# Patient Record
Sex: Male | Born: 1985 | Race: Black or African American | Hispanic: No | Marital: Married | Smoking: Current every day smoker
Health system: Southern US, Community
[De-identification: ages and names within clinical notes are randomized; demographics above are authoritative.]

## PROBLEM LIST (undated history)

## (undated) DIAGNOSIS — D573 Sickle-cell trait: Secondary | ICD-10-CM

## (undated) DIAGNOSIS — N4830 Priapism, unspecified: Secondary | ICD-10-CM

## (undated) HISTORY — PX: APPENDECTOMY: SHX54

---
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
## 2006-03-14 ENCOUNTER — Emergency Department (HOSPITAL_COMMUNITY): Admission: EM | Admit: 2006-03-14 | Discharge: 2006-03-14 | Payer: Self-pay | Admitting: Emergency Medicine

## 2006-05-12 ENCOUNTER — Emergency Department (HOSPITAL_COMMUNITY): Admission: EM | Admit: 2006-05-12 | Discharge: 2006-05-12 | Payer: Self-pay | Admitting: *Deleted

## 2006-08-22 ENCOUNTER — Encounter (INDEPENDENT_AMBULATORY_CARE_PROVIDER_SITE_OTHER): Payer: Self-pay | Admitting: Surgery

## 2006-08-22 ENCOUNTER — Inpatient Hospital Stay (HOSPITAL_COMMUNITY): Admission: EM | Admit: 2006-08-22 | Discharge: 2006-08-23 | Payer: Self-pay | Admitting: Emergency Medicine

## 2006-09-04 ENCOUNTER — Ambulatory Visit: Payer: Self-pay | Admitting: Family Medicine

## 2010-05-31 NOTE — H&P (Signed)
Shawn Fitzgerald, Shawn Fitzgerald              ACCOUNT NO.:  [id number]   MEDICAL RECORD NO.:  [id number]          PATIENT TYPE:  INP   LOCATION:  0103                         FACILITY:  [facility]   PHYSICIAN:  Angelia Mould. Derrell Lolling, M.D.DATE OF BIRTH:  1985-09-08   DATE OF ADMISSION:  08/22/2006  DATE OF DISCHARGE:                              HISTORY & PHYSICAL   CHIEF COMPLAINT:  Right lower quadrant abdominal pain.   HISTORY OF PRESENT ILLNESS:  This is a healthy 25 year old black male  who developed right lower quadrant pain about 7 hours ago, midnight last  night.  This has been persistent and steady in character.  He denies  nausea, vomiting, fever or chills.  Had a normal bowel movement  yesterday.  No prior similar problems.  He was evaluated in the  emergency room.  A CT scan was obtained, which showed an inflamed  appendix and a little bit of fluid in the pelvis.  I was called to see  him.   PAST MEDICAL HISTORY:  He has no medical or surgical problems.   CURRENT MEDICATIONS:  None.   DRUG ALLERGIES:  NONE KNOWN.   SOCIAL HISTORY:  He is single but has one child.  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  He does smoke, but says he is  trying to quit.  Drinks alcohol socially, but says he is quitting.  He  used to smoke marijuana, but says he has recently quit.   FAMILY HISTORY:  He has a daughter with sickle cell trait.  His mother  died of HIV.  Father living and has hypertension and diabetes.   REVIEW OF SYSTEMS:  The 15-system review of systems was obtained and is  noncontributory, except as described above.   PHYSICAL EXAMINATION:  A thin, healthy-appearing black man in mild  distress.  Temperature 96.7, pulse 64, respirations 18, blood pressure 112/75.  EYES:  Sclerae clear.  Extraocular movement is intact.  Ears, mouth,  throat, nose, tongue and oropharynx are without gross lesions.  NECK: Supple, nontender.  No mass.  No jugular distension.  LUNGS: Clear to  auscultation.  No chest wall tenderness.  HEART:  Regular rate and rhythm.  No murmur.  Radial, femoral and dorsalis pedis pulses are palpable.  No peripheral  edema.  ABDOMEN:  Soft, not distended.  Hypoactive bowel sounds; tender with  some guarding in the right lower quadrant; very localized.  No mass.  No  scars.  No hernias noted.  GENITOURINARY:  Penis, scrotum and testes are normal.  No inguinal mass.  EXTREMITIES:  Moves all four extremities well without pain or deformity.  NEUROLOGIC: No gross sensory deficits.   ADMISSION DATA:  CT scan as described above.  White blood cell count  6200.  Urinalysis normal.   ASSESSMENT:  ACUTE APPENDICITIS.  The fluid in the pelvis suggested that  he might have a ruptured appendix; but clinically he does not seem that  sick.   PLAN:  Admission.  IV Zosyn.  He will be taken to the operating room  sometime this morning.  I will be  asking Dr. Clovis Pu. Cornett  to  assume his care.  I have discussed this with the patient and he is in  full agreement.  All of his questions were answered.      Angelia Mould. Derrell Lolling, M.D.  Electronically Signed     HMI/MEDQ  D:  08/22/2006  T:  08/22/2006  Job:  [id number]

## 2010-05-31 NOTE — Op Note (Signed)
Shawn Fitzgerald, Shawn Fitzgerald              ACCOUNT NO.:  [id number]   MEDICAL RECORD NO.:  [id number]          PATIENT TYPE:  INP   LOCATION:  1529                         FACILITY:  [facility]   PHYSICIAN:  Thomas A. Cornett, M.D.DATE OF BIRTH:  1985-03-15   DATE OF PROCEDURE:  08/22/2006  DATE OF DISCHARGE:  08/23/2006                               OPERATIVE REPORT   PREOPERATIVE DIAGNOSES:  Acute appendicitis.   POSTOPERATIVE DIAGNOSES:  Acute appendicitis.   PROCEDURE:  Laparoscopic appendectomy.   SURGEON:  Dr. Harriette Bouillon.   ANESTHESIA:  General endotracheal anesthesia using 0.25% Sensorcaine  local with epinephrine.   ESTIMATED BLOOD LOSS:  20 mL.   SPECIMEN:  Appendix to pathology.   DRAINS:  None.   INDICATIONS FOR PROCEDURE:  The patient is a 25 year old  male with  right lower quadrant pain.  He was seen by Dr. Claud Kelp this  morning and evaluated and found to be acute appendicitis.  He presents  today for laparoscopic appendectomy after I had seen the patient,  reviewed his chart and examined him.  Consent was obtained.   DESCRIPTION OF PROCEDURE:  The patient was brought to the operating room  and placed supine.  The left arm was tucked after induction of general  endotracheal anesthesia.  A Foley catheter was placed and he received  preoperative antibiotics.  The abdomen was prepped and draped in a  sterile fashion. A 1-cm supraumbilical incision was made, dissection was  carried down to his fascia.  The fascia was opened with a scalpel and  Kochers used to grab the fascia and pull it upwards. I used a hemostat  to spread and open the peritoneum.  A pursestring suture of zero Vicryl  was placed and a 12-mm Hasson cannula was placed under direct vision.  Pneumoperitoneum was created at 15 mmHg with CO2 and a laparoscope was  placed.  Upon examination, the appendix was seen in the right lower  quadrant and it was quite inflamed.  It was a very short appendix.   Next  two 5 mm ports were placed, one in the midline approximately halfway  between the pubic symphysis and umbilicus.  A second one was placed in  the left lower quadrant under direct vision. The appendix was grabbed by  its tip.  The harmonic scalpel was used to take down the mesoappendix  all the way down to the base of the appendix at its junction with the  cecum.  Once this was done, a GIA 45 stapling device was used and placed  across the base of the appendix at the junction of the cecum, was fired  and the specimen was amputated. The specimen was put in a specimen bag  and pulled out through the umbilicus.  We used a 5-mm camera for  visualization __________.  I then inspected the stump and it was  hemostatic.  There was oozing from the mesoappendix.  This was  controlled with a small piece of Surgicel which worked well.  I examined  the terminal ileum and it was adhesed down in the right lower quadrant  but  no signs of obstruction were noted.  The remainder of his  laparoscopy was within normal limits with a normal-appearing liver,  gallbladder, stomach, colon and small bowel without any gross evidence  of disease.  At this point in time, I irrigated a small amount and  suctioned this out.  After the CO2 escape and removed  all of my ports.  The umbilical port was  removed and closed with a pursestring suture of 0 Vicryl.  A 4-0  Monocryl for skin was used for closure.  Dermabond was applied for skin  closure.  All final counts of sponge, needle and instruments were found  to be correct at this portion of case.  The patient awoke and taken to  recovery in satisfactory condition.      Thomas A. Cornett, M.D.  Electronically Signed     TAC/MEDQ  D:  08/22/2006  T:  08/23/2006  Job:  [id number]

## 2010-05-31 NOTE — Discharge Summary (Signed)
Shawn Fitzgerald, Shawn Fitzgerald              ACCOUNT NO.:  [id number]   MEDICAL RECORD NO.:  [id number]          PATIENT TYPE:  INP   LOCATION:  1529                         FACILITY:  [facility]   PHYSICIAN:  Thomas A. Cornett, M.D.DATE OF BIRTH:  September 21, 1985   DATE OF ADMISSION:  08/22/2006  DATE OF DISCHARGE:  08/23/2006                               DISCHARGE SUMMARY   MAIN DIAGNOSIS:  Acute appendicitis.   DISCHARGE DIAGNOSIS:  Acute appendicitis.   PROCEDURES PERFORMED:  Laparoscopic appendectomy.   BRIEF HISTORY:  The patient is a 25 year old male admitted on August 22, 2006 with acute appendicitis.   HOSPITAL COURSE:  The patient underwent laparoscopic appendectomy on  August 22, 2006.  He did well and on postop day 1 was discharged home.   DISCHARGE INSTRUCTIONS:  The patient will follow up in 2-3 weeks.  He  will be given a prescription for Vicodin ES for pain, refrain from  working and lifting for the next 7-10 days.  His diet will be regular.  He will refrain from driving until he if off narcotics and he is pain-  free.  He may shower.   CONDITION AT DISCHARGE:  Improved.      Thomas A. Cornett, M.D.  Electronically Signed     TAC/MEDQ  D:  08/23/2006  T:  08/23/2006  Job:  [id number]

## 2010-10-31 LAB — BASIC METABOLIC PANEL
CO2: 27
Chloride: 106
Potassium: 3.7

## 2010-10-31 LAB — DIFFERENTIAL
Eosinophils Relative: 3
Lymphocytes Relative: 28
Lymphs Abs: 1.7
Monocytes Absolute: 0.4
Monocytes Relative: 6

## 2010-10-31 LAB — CBC
HCT: 42.2
Hemoglobin: 14.2
RBC: 4.76
WBC: 6.2

## 2010-10-31 LAB — URINALYSIS, ROUTINE W REFLEX MICROSCOPIC
Glucose, UA: NEGATIVE
Nitrite: NEGATIVE
Specific Gravity, Urine: 1.018
pH: 6.5

## 2012-12-16 ENCOUNTER — Encounter (HOSPITAL_COMMUNITY): Payer: Self-pay | Admitting: Emergency Medicine

## 2012-12-16 ENCOUNTER — Emergency Department (HOSPITAL_COMMUNITY)
Admission: EM | Admit: 2012-12-16 | Discharge: 2012-12-17 | Disposition: A | Discharge status: Home / Self Care | Payer: Self-pay | Attending: Emergency Medicine | Admitting: Emergency Medicine

## 2012-12-16 DIAGNOSIS — N4830 Priapism, unspecified: Secondary | ICD-10-CM | POA: Insufficient documentation

## 2012-12-16 HISTORY — DX: Priapism, unspecified: N48.30

## 2012-12-16 NOTE — ED Notes (Signed)
Pt reports that he has had priapism since 0700 this morning. Had this x2 weeks ago and went away after 12 hours. Pt has not been evaluated for previous episodes of this.

## 2012-12-17 LAB — CBC WITH DIFFERENTIAL/PLATELET
Eosinophils Absolute: 0.1 10*3/uL (ref 0.0–0.7)
Lymphs Abs: 1.8 10*3/uL (ref 0.7–4.0)
MCH: 30.4 pg (ref 26.0–34.0)
Neutrophils Relative %: 70 % (ref 43–77)
Platelets: 157 10*3/uL (ref 150–400)
RBC: 5.27 MIL/uL (ref 4.22–5.81)
WBC: 8.4 10*3/uL (ref 4.0–10.5)

## 2012-12-17 LAB — RAPID URINE DRUG SCREEN, HOSP PERFORMED
Amphetamines: NOT DETECTED
Barbiturates: NOT DETECTED
Opiates: NOT DETECTED
Tetrahydrocannabinol: POSITIVE — AB

## 2012-12-17 LAB — BLOOD GAS, ARTERIAL
Bicarbonate: 14.9 mEq/L — ABNORMAL LOW (ref 20.0–24.0)
Patient temperature: 98.6
pH, Arterial: 6.907 — CL (ref 7.350–7.450)

## 2012-12-17 LAB — POCT I-STAT, CHEM 8
Creatinine, Ser: 1.2 mg/dL (ref 0.50–1.35)
HCT: 50 % (ref 39.0–52.0)
Hemoglobin: 17 g/dL (ref 13.0–17.0)
Potassium: 3.7 mEq/L (ref 3.5–5.1)
Sodium: 140 mEq/L (ref 135–145)

## 2012-12-17 MED ORDER — MORPHINE SULFATE 4 MG/ML IJ SOLN
4.0000 mg | Freq: Once | INTRAMUSCULAR | Status: AC
  Administered 2012-12-17: 4 mg via INTRAVENOUS
  Filled 2012-12-17: qty 1

## 2012-12-17 MED ORDER — SODIUM CHLORIDE 0.9 % IV SOLN
INTRAVENOUS | Status: DC
  Administered 2012-12-17: 05:00:00 via INTRAVENOUS

## 2012-12-17 MED ORDER — PHENYLEPHRINE 200 MCG/ML FOR PRIAPISM / HYPOTENSION
INTRAMUSCULAR | Status: AC
  Administered 2012-12-17: 06:00:00
  Filled 2012-12-17: qty 50

## 2012-12-17 MED ORDER — PHENYLEPHRINE 200 MCG/ML FOR PRIAPISM / HYPOTENSION
100.0000 ug | Freq: Once | INTRAMUSCULAR | Status: AC
  Administered 2012-12-17: 200 ug via INTRACAVERNOUS
  Filled 2012-12-17: qty 50

## 2012-12-17 MED ORDER — OXYCODONE-ACETAMINOPHEN 5-325 MG PO TABS: 1.0000 | ORAL_TABLET | ORAL | Status: DC | PRN

## 2012-12-17 NOTE — Progress Notes (Signed)
Patient ID: Shawn Fitzgerald, male   DOB: 07-10-1985, 27 y.o.   MRN: [id number]  Pt complains of minimal pain. Pain was "above a 10, I wanted to amputate my penis" prior to injection.   PE: Penis appears erect, but is soft and bendable. Minimal pain. Warm to touch. Small hematoma at base from procedures.   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 May d/c from GU point of view.

## 2012-12-17 NOTE — ED Notes (Signed)
Blood gas results given to Dr. Mena Goes.

## 2012-12-17 NOTE — ED Provider Notes (Signed)
Pt with priapism. Seen by urology. Was irrigated/injected. Remains with erection although somewhat softer and reports pain improved but still having some and that it is now worsening again. Apparently told nursing that he could go home "once it was down." Not clear from note what exact end point was. On exam pt with erection. Some asymetric swelling R base, suspect from injection.  Will discuss with urology again.   Raeford Razor, MD 12/17/12 1012

## 2012-12-17 NOTE — ED Provider Notes (Signed)
CSN: [id number]     Arrival date & time 12/16/12  2317 History   First MD Initiated Contact with Patient 12/17/12 0101     Chief Complaint  Patient presents with  . Priapism    (Consider location/radiation/quality/duration/timing/severity/associated sxs/prior Treatment) HPI  Patient reports he has had an erection since 7 AM this morning. He denies having sickle cell disease although he thinks he may have sickle cell trait. He denies any street drug use other than marijuana. He denies taking Viagra or Cialis. He states he had an episode in 2007 that lasted all day long however he did not seek medical treatment. He states he had an episode earlier this year and then again 2 weeks ago that both resolved on their own without treatment. He denies any other precipitating factors.  PCP none   Past Medical History  Diagnosis Date  . Priapism    Past Surgical History  Procedure Laterality Date  . Appendectomy     History reviewed. No pertinent family history. History  Substance Use Topics  . Smoking status: Never Smoker   . Smokeless tobacco: Never Used  . Alcohol Use: Yes     Comment: occ.  starting new job in 2 days Denies street drugs  Review of Systems  All other systems reviewed and are negative.    Allergies  Review of patient's allergies indicates no known allergies.  Home Medications  No current outpatient prescriptions on file.   BP 156/79  Pulse 73  Temp(Src) 97.8 F (36.6 C) (Oral)  Resp 20  SpO2 97%  Vital signs normal     Physical Exam  Nursing note and vitals reviewed. Constitutional: He is oriented to person, place, and time. He appears well-developed and well-nourished.  Non-toxic appearance. He does not appear ill. No distress.  HENT:  Head: Normocephalic and atraumatic.  Right Ear: External ear normal.  Left Ear: External ear normal.  Nose: Nose normal. No mucosal edema or rhinorrhea.  Mouth/Throat: Mucous membranes are normal. No dental  abscesses or uvula swelling.  Eyes: Conjunctivae and EOM are normal. Pupils are equal, round, and reactive to light.  Neck: Normal range of motion and full passive range of motion without pain. Neck supple.  Pulmonary/Chest: Effort normal. No respiratory distress. He has no rhonchi. He exhibits no crepitus.  Abdominal: Normal appearance.  Genitourinary:  priapism noted.  Musculoskeletal: Normal range of motion. He exhibits no edema and no tenderness.  Moves all extremities well.   Neurological: He is alert and oriented to person, place, and time. He has normal strength. No cranial nerve deficit.  Skin: Skin is warm, dry and intact. No rash noted. No erythema. No pallor.  Psychiatric: He has a normal mood and affect. His speech is normal and behavior is normal. His mood appears not anxious.    ED Course  Procedures (including critical care time)  Medications  0.9 %  sodium chloride infusion ( Intravenous Stopped 12/17/12 0645)  phenylephrine 200 mcg / ml CONC. DILUTION INJ (ED / Urology USE ONLY) (200 mcg Intracavernosal Given 12/17/12 0141)  morphine 4 MG/ML injection 4 mg (4 mg Intravenous Given 12/17/12 0458)  PHENYLEPHRINE 200 MCG/ML FOR PRIAPISM / HYPOTENSION 200 mcg/ml injection SOLN (  Given 12/17/12 0550)  morphine 4 MG/ML injection 4 mg (4 mg Intravenous Given 12/17/12 0643)   01:40 pt given 100 mcg of phenylephrine in each side (total 200 mcg).  Review of prior tests shows Hb of 14 in 2008  03:20 pt still has priapism,  states it was little better on the right but has returned. Pt injected with 250 mcg of phenylephrine in each side (total 500 mcg).   04:28 Pt still has priapism.  04:36 Dr Mena Goes will come see patient.   Labs Review Results for orders placed during the hospital encounter of 12/16/12  URINE RAPID DRUG SCREEN (HOSP PERFORMED)      Result Value Range   Opiates NONE DETECTED  NONE DETECTED   Cocaine NONE DETECTED  NONE DETECTED   Benzodiazepines NONE DETECTED   NONE DETECTED   Amphetamines NONE DETECTED  NONE DETECTED   Tetrahydrocannabinol POSITIVE (*) NONE DETECTED   Barbiturates NONE DETECTED  NONE DETECTED  CBC WITH DIFFERENTIAL      Result Value Range   WBC 8.4  4.0 - 10.5 K/uL   RBC 5.27  4.22 - 5.81 MIL/uL   Hemoglobin 16.0  13.0 - 17.0 g/dL   HCT 16.1  09.6 - 04.5 %   MCV 82.5  78.0 - 100.0 fL   MCH 30.4  26.0 - 34.0 pg   MCHC 36.8 (*) 30.0 - 36.0 g/dL   RDW 40.9  81.1 - 91.4 %   Platelets 157  150 - 400 K/uL   Neutrophils Relative % 70  43 - 77 %   Neutro Abs 5.9  1.7 - 7.7 K/uL   Lymphocytes Relative 22  12 - 46 %   Lymphs Abs 1.8  0.7 - 4.0 K/uL   Monocytes Relative 7  3 - 12 %   Monocytes Absolute 0.6  0.1 - 1.0 K/uL   Eosinophils Relative 1  0 - 5 %   Eosinophils Absolute 0.1  0.0 - 0.7 K/uL   Basophils Relative 0  0 - 1 %   Basophils Absolute 0.0  0.0 - 0.1 K/uL  POCT I-STAT, CHEM 8      Result Value Range   Sodium 140  135 - 145 mEq/L   Potassium 3.7  3.5 - 5.1 mEq/L   Chloride 103  96 - 112 mEq/L   BUN 13  6 - 23 mg/dL   Creatinine, Ser 7.82  0.50 - 1.35 mg/dL   Glucose, Bld 956 (*) 70 - 99 mg/dL   Calcium, Ion 2.13  0.86 - 1.23 mmol/L   TCO2 24  0 - 100 mmol/L   Hemoglobin 17.0  13.0 - 17.0 g/dL   HCT 57.8  46.9 - 62.9 %   Laboratory interpretation all normal except +UDS    Imaging Review No results found.  EKG Interpretation   None       MDM   1. Priapism     Disposition per Dr Mickeal Skinner, MD, Franz Dell, MD 12/17/12 [phone]

## 2012-12-17 NOTE — Progress Notes (Addendum)
Patient ID: Shawn Fitzgerald, male   DOB: 1985-10-29, 28 y.o.   MRN: [id number]  Procedure: penile aspiration and injection of phenylephrine Surgeon: Mena Goes  Description - right corpora prepped and 21 ga needle inserted near base. Irrigated poorly. Aspirated - ABG sent as reported earlier. Injected 200 microgram/1 ml q 3-5 minutes x 30 minutes. Confirmed mixture with nurse.   I made up new 200 microgram mix. Inserted 21 ga in left corpora. Aspirated well. Injcted 200 mcg / 1 ml q 3-5 min x 30 min.   Penis much softer and corpora compressible but still appears erect likely from ischemic changes. Pt with substantial relief of pain.   Pt tolerated well. EBL 30ml.   Add: I should add pt was asleep and resting when I finished. He requested pain meds when I removed the needles.

## 2012-12-17 NOTE — ED Notes (Signed)
Pt reports having erection again, sts pain is also increasing. Dr Juleen China notified.

## 2012-12-17 NOTE — Consult Note (Signed)
Consult: priapism Requested by Dr. Andreas Ohm  History of Present Illness:  Pt has a rigid painful erection since he woke up at 7 AM yesterday morning. Dr. Lynelle Doctor tried to inject both corprora.  Pt with h/o priapism several years ago and several weeks ago, both of which resolved.   Past Medical History  Diagnosis Date  . Priapism    Past Surgical History  Procedure Laterality Date  . Appendectomy      Home Medications:   (Not in a hospital admission) Allergies: No Known Allergies  History reviewed. No pertinent family history. Social History:  reports that he has never smoked. He has never used smokeless tobacco. He reports that he drinks alcohol. He reports that he uses illicit drugs (Marijuana).  ROS: A complete review of systems was performed.  All systems are negative except for pertinent findings as noted. Review of Systems  All other systems reviewed and are negative.     Physical Exam:  Vital signs in last 24 hours: Temp:  [97.8 F (36.6 C)-98.8 F (37.1 C)] 97.8 F (36.6 C) (12/02 0304) Pulse Rate:  [66-73] 73 (12/02 0304) Resp:  [16-20] 20 (12/02 0304) BP: (156-157)/(79-82) 156/79 mmHg (12/02 0304) SpO2:  [97 %] 97 % (12/02 0304) General:  Alert and oriented, No acute distress HEENT: Normocephalic, atraumatic Neck: No JVD or lymphadenopathy Cardiovascular: Regular rate and rhythm Lungs: Regular rate and effort Abdomen: Soft, nontender, nondistended, no abdominal masses Back: No CVA tenderness Extremities: No edema Neurologic: Grossly intact GU: painful, rigid erection  Laboratory Data:  Results for orders placed during the hospital encounter of 12/16/12 (from the past 24 hour(s))  URINE RAPID DRUG SCREEN (HOSP PERFORMED)     Status: Abnormal   Collection Time    12/17/12  2:27 AM      Result Value Range   Opiates NONE DETECTED  NONE DETECTED   Cocaine NONE DETECTED  NONE DETECTED   Benzodiazepines NONE DETECTED  NONE DETECTED   Amphetamines NONE  DETECTED  NONE DETECTED   Tetrahydrocannabinol POSITIVE (*) NONE DETECTED   Barbiturates NONE DETECTED  NONE DETECTED  POCT I-STAT, CHEM 8     Status: Abnormal   Collection Time    12/17/12  4:56 AM      Result Value Range   Sodium 140  135 - 145 mEq/L   Potassium 3.7  3.5 - 5.1 mEq/L   Chloride 103  96 - 112 mEq/L   BUN 13  6 - 23 mg/dL   Creatinine, Ser 1.61  0.50 - 1.35 mg/dL   Glucose, Bld 096 (*) 70 - 99 mg/dL   Calcium, Ion 0.45  4.09 - 1.23 mmol/L   TCO2 24  0 - 100 mmol/L   Hemoglobin 17.0  13.0 - 17.0 g/dL   HCT 81.1  91.4 - 78.2 %   No results found for this or any previous visit (from the past 240 hour(s)). Creatinine:  Recent Labs  12/17/12 0456  CREATININE 1.20    Impression/Assessment:  Priapism  Plan:  Discussed with patient nature, R/B/A to irrigation/aspiration attempt including OR for distal shunt. He elects to proceed with Irrigation/aspiration.   Antony Haste 12/17/2012, 4:59 AM

## 2012-12-17 NOTE — Progress Notes (Signed)
[facility] provided pt with a list of primary care resources and a [facility] Orange Card application.  °

## 2012-12-17 NOTE — ED Notes (Signed)
Pt reports some pain relief at this time, penis is still erected, however pt sts it's much better now. Will continue to monitor.

## 2012-12-17 NOTE — Progress Notes (Signed)
Patient ID: Shawn Fitzgerald, male   DOB: [birth year], 27 y.o.   MRN: [id number]  Penile ABG: pH 6.9, pCO2 78.9, pO2 below reportable range, sO2 21%   Pt reports he voided with a good flow prior to my arrival.   Discussed risks of prolonged priapism and distal shunts - impotence among others.

## 2013-01-14 ENCOUNTER — Encounter (HOSPITAL_COMMUNITY): Payer: Self-pay | Admitting: Emergency Medicine

## 2013-01-14 ENCOUNTER — Emergency Department (HOSPITAL_COMMUNITY)
Admission: EM | Admit: 2013-01-14 | Discharge: 2013-01-14 | Disposition: A | Discharge status: Home / Self Care | Payer: Self-pay | Attending: Emergency Medicine | Admitting: Emergency Medicine

## 2013-01-14 DIAGNOSIS — N529 Male erectile dysfunction, unspecified: Secondary | ICD-10-CM

## 2013-01-14 NOTE — ED Provider Notes (Signed)
CSN: [id number]     Arrival date & time 01/14/13  0106 History   First MD Initiated Contact with Patient 01/14/13 [phone]     Chief Complaint  Patient presents with  . Erectile Dysfunction   (Consider location/radiation/quality/duration/timing/severity/associated sxs/prior Treatment) HPI Comments: Patient reports having erectile dysfunction since having priaprism treated 4 weeks ago. No other complaints.  Patient is a 27 y.o. male presenting with erectile dysfunction. The history is provided by the patient. No language interpreter was used.  Erectile Dysfunction This is a new problem. The current episode started 1 to 4 weeks ago. The problem occurs constantly. The problem has been unchanged. Pertinent negatives include no abdominal pain, change in bowel habit, fever, neck pain or urinary symptoms. Nothing aggravates the symptoms. He has tried nothing for the symptoms. The treatment provided no relief.    Past Medical History  Diagnosis Date  . Priapism    Past Surgical History  Procedure Laterality Date  . Appendectomy     History reviewed. No pertinent family history. History  Substance Use Topics  . Smoking status: Never Smoker   . Smokeless tobacco: Never Used  . Alcohol Use: Yes     Comment: occ.    Review of Systems  Constitutional: Negative for fever.  Gastrointestinal: Negative for abdominal pain and change in bowel habit.  Musculoskeletal: Negative for neck pain.  All other systems reviewed and are negative.    Allergies  Review of patient's allergies indicates no known allergies.  Home Medications   Current Outpatient Rx  Name  Route  Sig  Dispense  Refill  . oxyCODONE-acetaminophen (PERCOCET/ROXICET) 5-325 MG per tablet   Oral   Take 1 tablet by mouth every 4 (four) hours as needed for severe pain.   6 tablet   0    BP 124/64  Pulse 62  Temp(Src) 98.6 F (37 C) (Oral)  Resp 15  Ht 5\' 10"  (1.778 m)  Wt 155 lb (70.308 kg)  BMI 22.24 kg/m2  SpO2  98% Physical Exam  Nursing note and vitals reviewed. Constitutional: He is oriented to person, place, and time. He appears well-developed and well-nourished.  HENT:  Head: Normocephalic and atraumatic.  Eyes: Conjunctivae and EOM are normal.  Neck: Normal range of motion.  Cardiovascular: Normal rate.   Pulmonary/Chest: Effort normal.  Abdominal: He exhibits no distension.  Genitourinary: Penis normal.  Normal circumcised male, no masses, lesions, or other abnormality or deformity penis, scrotum, testicles, no evidence of infection, no discharge  Musculoskeletal: Normal range of motion.  Neurological: He is alert and oriented to person, place, and time.  Skin: Skin is dry.  Psychiatric: He has a normal mood and affect. His behavior is normal. Judgment and thought content normal.    ED Course  Procedures (including critical care time) Labs Review Labs Reviewed - No data to display Imaging Review No results found.  EKG Interpretation   None       MDM   1. Erectile dysfunction    Patient with erectile dysfunction. Patient recently had treatment for priaprism. He states that he is been unable to obtain an erection since treatment. Recommend urology followup.  Patient seen by and discussed with Dr. Dierdre Highman, who agrees with the plan.  Patient is stable and ready for discharge.    Roxy Horseman, PA-C 01/14/13 [phone]

## 2013-01-14 NOTE — ED Provider Notes (Signed)
Medical screening examination/treatment/procedure(s) were performed by non-physician practitioner and as supervising physician I was immediately available for consultation/collaboration.    Jolanda Mccann, MD 01/14/13 2304 

## 2013-01-14 NOTE — ED Notes (Signed)
Pt reports pain when trying to obtain an erection. Pt has hx of priapism with the last episode the beginning of the month that lasted 5 days.

## 2014-07-16 ENCOUNTER — Encounter (HOSPITAL_COMMUNITY): Payer: Self-pay | Admitting: *Deleted

## 2014-07-16 ENCOUNTER — Emergency Department (HOSPITAL_COMMUNITY): Payer: Self-pay

## 2014-07-16 ENCOUNTER — Emergency Department (HOSPITAL_COMMUNITY)
Admission: EM | Admit: 2014-07-16 | Discharge: 2014-07-16 | Disposition: A | Discharge status: Home / Self Care | Payer: Self-pay | Attending: Emergency Medicine | Admitting: Emergency Medicine

## 2014-07-16 DIAGNOSIS — S43005A Unspecified dislocation of left shoulder joint, initial encounter: Secondary | ICD-10-CM

## 2014-07-16 DIAGNOSIS — X58XXXA Exposure to other specified factors, initial encounter: Secondary | ICD-10-CM | POA: Insufficient documentation

## 2014-07-16 DIAGNOSIS — Y9389 Activity, other specified: Secondary | ICD-10-CM | POA: Insufficient documentation

## 2014-07-16 DIAGNOSIS — Y92009 Unspecified place in unspecified non-institutional (private) residence as the place of occurrence of the external cause: Secondary | ICD-10-CM | POA: Insufficient documentation

## 2014-07-16 DIAGNOSIS — Y998 Other external cause status: Secondary | ICD-10-CM | POA: Insufficient documentation

## 2014-07-16 DIAGNOSIS — Z87448 Personal history of other diseases of urinary system: Secondary | ICD-10-CM | POA: Insufficient documentation

## 2014-07-16 DIAGNOSIS — S43015A Anterior dislocation of left humerus, initial encounter: Secondary | ICD-10-CM | POA: Insufficient documentation

## 2014-07-16 MED ORDER — HYDROCODONE-ACETAMINOPHEN 5-325 MG PO TABS: 1.0000 | ORAL_TABLET | ORAL | Status: DC | PRN

## 2014-07-16 MED ORDER — ONDANSETRON HCL 4 MG/2ML IJ SOLN
4.0000 mg | Freq: Once | INTRAMUSCULAR | Status: AC | PRN
  Administered 2014-07-16: 4 mg via INTRAVENOUS
  Filled 2014-07-16: qty 2

## 2014-07-16 MED ORDER — PROPOFOL 10 MG/ML IV BOLUS
40.0000 mg | INTRAVENOUS | Status: DC | PRN
  Filled 2014-07-16: qty 1

## 2014-07-16 MED ORDER — HYDROMORPHONE HCL 1 MG/ML IJ SOLN
1.0000 mg | INTRAMUSCULAR | Status: DC | PRN
  Administered 2014-07-16: 1 mg via INTRAVENOUS
  Filled 2014-07-16: qty 1

## 2014-07-16 MED ORDER — HYDROMORPHONE HCL 1 MG/ML IJ SOLN
1.0000 mg | Freq: Once | INTRAMUSCULAR | Status: AC
  Administered 2014-07-16: 1 mg via INTRAVENOUS
  Filled 2014-07-16: qty 1

## 2014-07-16 MED ORDER — PROPOFOL 10 MG/ML IV BOLUS
INTRAVENOUS | Status: AC | PRN
  Administered 2014-07-16 (×2): 40 mg via INTRAVENOUS

## 2014-07-16 NOTE — Discharge Instructions (Signed)
Shoulder Dislocation ° Shoulder dislocation is when your upper arm bone (humerus) is forced out of your shoulder joint. Your doctor will put your shoulder back into the joint by pulling on your arm or through surgery. Your arm will be placed in a shoulder immobilizer or sling. The shoulder immobilizer or sling holds your shoulder in place while it heals. °HOME CARE  °· Rest your injured joint. Do not move it until instructed to do so. °· Put ice on your injured joint as told by your doctor. °¨ Put ice in a plastic bag. °¨ Place a towel between your skin and the bag. °¨ Leave the ice on for 15-20 minutes at a time, every 2 hours while you are awake. °· Only take medicines as told by your doctor. °· Squeeze a ball to exercise your hand. °GET HELP RIGHT AWAY IF:  °· Your splint or sling becomes damaged. °· Your pain becomes worse, not better. °· You lose feeling in your arm or hand. °· Your arm or hand becomes white or cold. °MAKE SURE YOU:  °· Understand these instructions. °· Will watch your condition. °· Will get help right away if you are not doing well or get worse. °Document Released: 03/27/2011 Document Reviewed: 03/27/2011 °Exit[employer]® Patient Information ©2015 [employer]. This information is not intended to replace advice given to you by your health care provider. Make sure you discuss any questions you have with your health care provider. ° °

## 2014-07-16 NOTE — ED Provider Notes (Signed)
CSN: [id number]     Arrival date & time 07/16/14  1128 History   First MD Initiated Contact with Patient 07/16/14 1130     Chief Complaint  Patient presents with  . Dislocation   HPI Patient presents to the emergency room with complaints of acute left shoulder pain. The patient was playing with his children at home. He did not fall but he acutely felt pain in his left shoulder. He also noted a deformity.  The pain is severe. Any movement whatsoever exacerbates the pain. He cannot find a comfortable position. Patient called EMS and was brought into the emergency room. Past Medical History  Diagnosis Date  . Priapism    Past Surgical History  Procedure Laterality Date  . Appendectomy     No family history on file. History  Substance Use Topics  . Smoking status: Never Smoker   . Smokeless tobacco: Never Used  . Alcohol Use: Yes     Comment: occ.    Review of Systems  Constitutional: Positive for diaphoresis.  Gastrointestinal: Negative for nausea and vomiting.  All other systems reviewed and are negative.     Allergies  Review of patient's allergies indicates no known allergies.  Home Medications   Prior to Admission medications   Medication Sig Start Date End Date Taking? Authorizing Provider  HYDROcodone-acetaminophen (NORCO/VICODIN) 5-325 MG per tablet Take 1-2 tablets by mouth every 4 (four) hours as needed. 07/16/14   Linwood Dibbles, MD   BP 120/97 mmHg  Pulse 72  Temp(Src) 97.7 F (36.5 C) (Oral)  Resp 18  SpO2 100% Physical Exam  Constitutional: He appears well-developed and well-nourished. He appears distressed.  HENT:  Head: Normocephalic and atraumatic.  Right Ear: External ear normal.  Left Ear: External ear normal.  Mouth/Throat: No oropharyngeal exudate.  Eyes: Conjunctivae are normal. Right eye exhibits no discharge. Left eye exhibits no discharge. No scleral icterus.  Neck: Neck supple. No tracheal deviation present.  Cardiovascular: Normal rate and  regular rhythm.   Pulmonary/Chest: Effort normal. No stridor. No respiratory distress. He has no wheezes.  Abdominal: Soft. He exhibits no distension. There is no tenderness.  Musculoskeletal: He exhibits no edema.       Left shoulder: He exhibits tenderness, bony tenderness, deformity and pain. He exhibits normal pulse and normal strength.  Normal radial pulse, normal sensation in his hand  Neurological: He is alert. Cranial nerve deficit: no gross deficits.  Skin: Skin is warm and dry. No rash noted.  Psychiatric: He has a normal mood and affect.  Nursing note and vitals reviewed.   ED Course  Reduction of dislocation Date/Time: 07/16/2014 12:53 PM Performed by: Linwood Dibbles Authorized by: Linwood Dibbles Consent: Verbal consent obtained. Written consent obtained. Risks and benefits: risks, benefits and alternatives were discussed Consent given by: patient Patient understanding: patient states understanding of the procedure being performed Patient consent: the patient's understanding of the procedure matches consent given Patient identity confirmed: verbally with patient and arm band Time out: Immediately prior to procedure a "time out" was called to verify the correct patient, procedure, equipment, support staff and site/side marked as required. Comments: Traction counter traction, on exam, appears reduced.  Will confirm with xray  Procedural sedation Date/Time: 07/16/2014 12:54 PM Performed by: Linwood Dibbles Authorized by: Linwood Dibbles Consent: Verbal consent obtained. Written consent obtained. Risks and benefits: risks, benefits and alternatives were discussed Consent given by: patient Patient identity confirmed: verbally with patient Time out: Immediately prior to procedure a "time out" was  called to verify the correct patient, procedure, equipment, support staff and site/side marked as required. Patient sedated: yes Sedation type: moderate (conscious) sedation Sedatives:  propofol Sedation start date/time: 07/16/2014 12:40 PM Sedation end date/time: 07/16/2014 12:54 PM Vitals: Vital signs were monitored during sedation. Patient tolerance: Patient tolerated the procedure well with no immediate complications   (including critical care time) Labs Review Labs Reviewed - No data to display  Imaging Review Dg Shoulder Left  07/16/2014   CLINICAL DATA:  Post reduction.  EXAM: LEFT SHOULDER - 2+ VIEW  COMPARISON:  None.  FINDINGS: Shoulder dislocation has been reduced. No concerning osseous features.  IMPRESSION: Satisfactory postreduction appearance.   Electronically Signed   By: Elsie StainJohn T Curnes M.D.   On: 07/16/2014 13:52   Dg Shoulder Left  07/16/2014   CLINICAL DATA:  29 year old male with a history of left shoulder pain and dislocation.  EXAM: LEFT SHOULDER - 2+ VIEW  COMPARISON:  05/12/2006  FINDINGS: Left anterior shoulder dislocation. No acute bony abnormality identified, though details are somewhat obscured by the overlapping bony structures.  IMPRESSION: Left anterior shoulder dislocation. Repeat imaging is warranted once reduced.  Signed,  Yvone NeuJaime S. Loreta AveWagner, DO  [facility]  [facility]   Electronically Signed   By: Gilmer MorJaime  Wagner D.O.   On: 07/16/2014 12:26    Medications  HYDROmorphone (DILAUDID) injection 1 mg (1 mg Intravenous Given 07/16/14 1227)  propofol (DIPRIVAN) 10 mg/mL bolus/IV push 40 mg (not administered)  HYDROmorphone (DILAUDID) injection 1 mg (1 mg Intravenous Given 07/16/14 1156)  ondansetron (ZOFRAN) injection 4 mg (4 mg Intravenous Given 07/16/14 1356)  propofol (DIPRIVAN) 10 mg/mL bolus/IV push ( Intravenous Stopped 07/16/14 1258)     MDM   Final diagnoses:  Shoulder dislocation, left, initial encounter    Pre and post xrays personally reviewed.  Successful reduction of a shoulder dislocation.  Dc home with a shoulder imobilizer.  Follow up with orthopedic surgery for further outpatient  evaluation and treatment.   Linwood DibblesJon Sharelle Burditt, MD 07/16/14 [phone]

## 2014-07-16 NOTE — ED Notes (Signed)
Bed: NF62WA12 Expected date:  Expected time:  Means of arrival:  Comments: Ems- dislocated shoulder

## 2014-07-16 NOTE — Progress Notes (Signed)
CM spoke with pt and per pt permission with male visitor at bedside who [place].  CM discussed and provided written information for self pay pcps, discussed the importance of pcp vs EDP services for f/u care, www.needymeds.org, www.goodrx.com, discounted pharmacies and other [place] resources such as [facility] , [facility], affordable care act,  [place] med assist, financial assistance, self pay dental services, [place] med assist, DSS and  health department  Reviewed resources for [place] self pay pcps like [facility], [facility] medicine [facility] [address], [facility], [facility], local urgent care centers, [facility], [facility], general medical clinics, family services of the [place], [facility] plus others, medication resources, [facility] out patient pharmacies and housing Pt voiced understanding and appreciation of resources provided   Provided [facility] contact information Pt agreed to a referral Cm completed referral Pt to be contact by [facility] clinical liason

## 2014-07-16 NOTE — ED Notes (Addendum)
Per EMS report: pt was playing with child and dislocated left shoulder. Deformity noted by EMS. EMS reports good radial pulses.  Pt a/o x 4.  Hx of left shoulder dislocation.  EMS gave pt of fentanyl.

## 2014-09-22 ENCOUNTER — Encounter (HOSPITAL_COMMUNITY): Payer: Self-pay | Admitting: *Deleted

## 2014-09-22 ENCOUNTER — Emergency Department (HOSPITAL_COMMUNITY)
Admission: EM | Admit: 2014-09-22 | Discharge: 2014-09-22 | Disposition: A | Discharge status: Home / Self Care | Payer: Self-pay | Attending: Emergency Medicine | Admitting: Emergency Medicine

## 2014-09-22 ENCOUNTER — Emergency Department (HOSPITAL_COMMUNITY): Payer: Self-pay

## 2014-09-22 DIAGNOSIS — R109 Unspecified abdominal pain: Secondary | ICD-10-CM

## 2014-09-22 DIAGNOSIS — R1030 Lower abdominal pain, unspecified: Secondary | ICD-10-CM | POA: Insufficient documentation

## 2014-09-22 DIAGNOSIS — Z87438 Personal history of other diseases of male genital organs: Secondary | ICD-10-CM | POA: Insufficient documentation

## 2014-09-22 LAB — CBC WITH DIFFERENTIAL/PLATELET
BASOS PCT: 1 % (ref 0–1)
Basophils Absolute: 0 10*3/uL (ref 0.0–0.1)
EOS ABS: 0.2 10*3/uL (ref 0.0–0.7)
Eosinophils Relative: 5 % (ref 0–5)
HEMATOCRIT: 42.3 % (ref 39.0–52.0)
HEMOGLOBIN: 14.8 g/dL (ref 13.0–17.0)
LYMPHS ABS: 1.4 10*3/uL (ref 0.7–4.0)
Lymphocytes Relative: 41 % (ref 12–46)
MCH: 30.5 pg (ref 26.0–34.0)
MCHC: 35 g/dL (ref 30.0–36.0)
MCV: 87 fL (ref 78.0–100.0)
Monocytes Absolute: 0.3 10*3/uL (ref 0.1–1.0)
Monocytes Relative: 9 % (ref 3–12)
NEUTROS ABS: 1.6 10*3/uL — AB (ref 1.7–7.7)
NEUTROS PCT: 44 % (ref 43–77)
Platelets: 159 10*3/uL (ref 150–400)
RBC: 4.86 MIL/uL (ref 4.22–5.81)
RDW: 13.5 % (ref 11.5–15.5)
WBC: 3.5 10*3/uL — AB (ref 4.0–10.5)

## 2014-09-22 LAB — URINE MICROSCOPIC-ADD ON

## 2014-09-22 LAB — URINALYSIS, ROUTINE W REFLEX MICROSCOPIC
BILIRUBIN URINE: NEGATIVE
Glucose, UA: NEGATIVE mg/dL
HGB URINE DIPSTICK: NEGATIVE
KETONES UR: NEGATIVE mg/dL
NITRITE: NEGATIVE
Protein, ur: NEGATIVE mg/dL
Specific Gravity, Urine: 1.015 (ref 1.005–1.030)
UROBILINOGEN UA: 0.2 mg/dL (ref 0.0–1.0)
pH: 6 (ref 5.0–8.0)

## 2014-09-22 LAB — BASIC METABOLIC PANEL
Anion gap: 4 — ABNORMAL LOW (ref 5–15)
BUN: 13 mg/dL (ref 6–20)
CHLORIDE: 104 mmol/L (ref 101–111)
CO2: 29 mmol/L (ref 22–32)
Calcium: 8.8 mg/dL — ABNORMAL LOW (ref 8.9–10.3)
Creatinine, Ser: 0.99 mg/dL (ref 0.61–1.24)
GFR calc non Af Amer: >60 mL/min (ref >60)
Glucose, Bld: 86 mg/dL (ref 65–99)
POTASSIUM: 4 mmol/L (ref 3.5–5.1)
SODIUM: 137 mmol/L (ref 135–145)

## 2014-09-22 MED ORDER — KETOROLAC TROMETHAMINE 30 MG/ML IJ SOLN
30.0000 mg | Freq: Once | INTRAMUSCULAR | Status: AC
  Administered 2014-09-22: 30 mg via INTRAVENOUS
  Filled 2014-09-22: qty 1

## 2014-09-22 MED ORDER — ONDANSETRON HCL 4 MG/2ML IJ SOLN
4.0000 mg | Freq: Once | INTRAMUSCULAR | Status: AC
  Administered 2014-09-22: 4 mg via INTRAVENOUS
  Filled 2014-09-22: qty 2

## 2014-09-22 MED ORDER — TRAMADOL HCL 50 MG PO TABS: 50.0000 mg | ORAL_TABLET | Freq: Four times a day (QID) | ORAL | Status: DC | PRN

## 2014-09-22 NOTE — ED Notes (Signed)
Pt alert x4 respirations easy non labored.  

## 2014-09-22 NOTE — ED Notes (Signed)
Bed: WA04 Expected date:  Expected time:  Means of arrival:  Comments: ems 

## 2014-09-22 NOTE — Discharge Instructions (Signed)

## 2014-09-22 NOTE — ED Provider Notes (Signed)
CSN: [id number]     Arrival date & time 09/22/14  0820 History   First MD Initiated Contact with Patient 09/22/14 [phone]     Chief Complaint  Patient presents with  . Abdominal Pain     (Consider location/radiation/quality/duration/timing/severity/associated sxs/prior Treatment) HPI Comments: Pt comes in with c/o generalized lower abdominal pain. No fever.he states that prior to urinating here the pain had him doubled over. No vomiting or diarrhea. States that he is nauseated now. Symptoms started this morning. No history of stones. Denies vaginal discharge.  The history is provided by the patient. No language interpreter was used.    Past Medical History  Diagnosis Date  . Priapism    Past Surgical History  Procedure Laterality Date  . Appendectomy     History reviewed. No pertinent family history. Social History  Substance Use Topics  . Smoking status: Never Smoker   . Smokeless tobacco: Never Used  . Alcohol Use: Yes     Comment: occ.    Review of Systems  All other systems reviewed and are negative.     Allergies  Review of patient's allergies indicates no known allergies.  Home Medications   Prior to Admission medications   Medication Sig Start Date End Date Taking? Authorizing Provider  HYDROcodone-acetaminophen (NORCO/VICODIN) 5-325 MG per tablet Take 1-2 tablets by mouth every 4 (four) hours as needed. Patient not taking: Reported on 09/22/2014 07/16/14   Linwood Dibbles, MD   BP 112/78 mmHg  Pulse 54  Temp(Src) 97.5 F (36.4 C) (Oral)  Resp 20  SpO2 100% Physical Exam  Constitutional: He appears well-developed and well-nourished.  Cardiovascular: Normal rate and regular rhythm.   Pulmonary/Chest: Effort normal and breath sounds normal.  Abdominal: Soft. Bowel sounds are normal. There is tenderness in the suprapubic area.  Musculoskeletal: Normal range of motion.  Neurological: He is alert.  Skin: Skin is warm and dry.  Psychiatric: He has a normal mood and  affect.  Nursing note and vitals reviewed.   ED Course  Procedures (including critical care time) Labs Review Labs Reviewed  CBC WITH DIFFERENTIAL/PLATELET - Abnormal; Notable for the following:    WBC 3.5 (*)    Neutro Abs 1.6 (*)    All other components within normal limits  BASIC METABOLIC PANEL - Abnormal; Notable for the following:    Calcium 8.8 (*)    Anion gap 4 (*)    All other components within normal limits  URINALYSIS, ROUTINE W REFLEX MICROSCOPIC (NOT AT [facility]) - Abnormal; Notable for the following:    APPearance CLOUDY (*)    Leukocytes, UA TRACE (*)    All other components within normal limits  URINE CULTURE  URINE MICROSCOPIC-ADD ON    Imaging Review Ct Renal Stone Study  09/22/2014   CLINICAL DATA:  Abdominal pain for several hours with both sharp and cramping type sensations  EXAM: CT ABDOMEN AND PELVIS WITHOUT CONTRAST  TECHNIQUE: Multidetector CT imaging of the abdomen and pelvis was performed following the standard protocol without oral or intravenous contrast material administration.  COMPARISON:  August 22, 2006  FINDINGS: Lung bases are clear.  No focal liver lesions are identified on this noncontrast enhanced study. Gallbladder wall is not appreciably thickened. There is no biliary duct dilatation.  Spleen, pancreas, and adrenals appear within normal limits. Kidneys bilaterally show no mass or hydronephrosis on either side. There is no appreciable renal or ureteral calculus on either side.  In the pelvis, the urinary bladder is midline with normal  wall thickness. There is no pelvic mass or pelvic fluid collection. Appendix is absent.  There is moderate stool throughout the colon. There is no bowel obstruction. No free air or portal venous air.  There is no demonstrable ascites, adenopathy, or abscess in the abdomen or pelvis. There is no abdominal aortic aneurysm. There are no blastic or lytic bone lesions.  IMPRESSION: A cause for the patient's symptoms has not been  established with this study. Appendix absent. No bowel obstruction. No abscess. No bowel wall thickening. No renal or ureteral calculus. No hydronephrosis.   Electronically Signed   By: Bretta Bang III M.D.   On: 09/22/2014 09:17   I have personally reviewed and evaluated these images and lab results as part of my medical decision-making.   EKG Interpretation None      MDM   Final diagnoses:  Abdominal pain, unspecified abdominal location    No acute process noted to explain pts symptoms. Will give ultram for pain. Discussed return precautions for pt. No definite infection in urine. Urine sent for culture    Teressa Lower, NP 09/22/14 [id number]  Pricilla Loveless, MD 09/23/14 [phone]

## 2014-09-22 NOTE — ED Notes (Signed)
Per EMS pt coming from home with c/o generalized, sharp, cramping abdominal pain that started around 0630 today. Denies n/v/d, urinary difficulty.

## 2014-09-23 LAB — URINE CULTURE: CULTURE: NO GROWTH

## 2015-02-11 ENCOUNTER — Emergency Department (HOSPITAL_COMMUNITY)
Admission: EM | Admit: 2015-02-11 | Discharge: 2015-02-11 | Disposition: A | Discharge status: Home / Self Care | Payer: Self-pay | Attending: Emergency Medicine | Admitting: Emergency Medicine

## 2015-02-11 ENCOUNTER — Encounter (HOSPITAL_COMMUNITY): Payer: Self-pay | Admitting: Emergency Medicine

## 2015-02-11 ENCOUNTER — Emergency Department (HOSPITAL_COMMUNITY): Payer: Self-pay

## 2015-02-11 DIAGNOSIS — S43005A Unspecified dislocation of left shoulder joint, initial encounter: Secondary | ICD-10-CM

## 2015-02-11 DIAGNOSIS — S43015A Anterior dislocation of left humerus, initial encounter: Secondary | ICD-10-CM | POA: Insufficient documentation

## 2015-02-11 DIAGNOSIS — Y9389 Activity, other specified: Secondary | ICD-10-CM | POA: Insufficient documentation

## 2015-02-11 DIAGNOSIS — Y9289 Other specified places as the place of occurrence of the external cause: Secondary | ICD-10-CM | POA: Insufficient documentation

## 2015-02-11 DIAGNOSIS — X58XXXA Exposure to other specified factors, initial encounter: Secondary | ICD-10-CM | POA: Insufficient documentation

## 2015-02-11 DIAGNOSIS — Y998 Other external cause status: Secondary | ICD-10-CM | POA: Insufficient documentation

## 2015-02-11 DIAGNOSIS — Z87438 Personal history of other diseases of male genital organs: Secondary | ICD-10-CM | POA: Insufficient documentation

## 2015-02-11 MED ORDER — SODIUM CHLORIDE 0.9 % IV SOLN
INTRAVENOUS | Status: AC | PRN
  Administered 2015-02-11: 125 mL/h via INTRAVENOUS

## 2015-02-11 MED ORDER — HYDROMORPHONE HCL 1 MG/ML IJ SOLN
0.5000 mg | Freq: Once | INTRAMUSCULAR | Status: AC
  Administered 2015-02-11: 0.5 mg via INTRAVENOUS
  Filled 2015-02-11: qty 1

## 2015-02-11 MED ORDER — PROPOFOL 10 MG/ML IV BOLUS
INTRAVENOUS | Status: AC | PRN
  Administered 2015-02-11: 30 mg via INTRAVENOUS

## 2015-02-11 MED ORDER — PROPOFOL 10 MG/ML IV BOLUS
0.5000 mg/kg | Freq: Once | INTRAVENOUS | Status: DC
  Filled 2015-02-11: qty 20

## 2015-02-11 MED ORDER — HYDROMORPHONE HCL 1 MG/ML IJ SOLN
1.0000 mg | Freq: Once | INTRAMUSCULAR | Status: AC
  Administered 2015-02-11: 1 mg via INTRAVENOUS
  Filled 2015-02-11: qty 1

## 2015-02-11 MED ORDER — FENTANYL CITRATE (PF) 100 MCG/2ML IJ SOLN
50.0000 ug | Freq: Once | INTRAMUSCULAR | Status: AC
  Administered 2015-02-11: 50 ug via INTRAVENOUS
  Filled 2015-02-11: qty 2

## 2015-02-11 MED ORDER — OXYCODONE-ACETAMINOPHEN 5-325 MG PO TABS: 1.0000 | ORAL_TABLET | Freq: Four times a day (QID) | ORAL | Status: DC | PRN

## 2015-02-11 NOTE — ED Provider Notes (Signed)
CSN: [id number]     Arrival date & time 02/11/15  1203 History   First MD Initiated Contact with Patient 02/11/15 1307     Chief Complaint  Patient presents with  . Shoulder Injury      Patient is a 30 y.o. male presenting with shoulder injury. The history is provided by the patient.  Shoulder Injury This is a recurrent problem. Pertinent negatives include no chest pain and no shortness of breath.   patient resents with a left shoulder dislocation. He did the same back in June. States she was trying to get out of bed today and shoulder popped out. Moderate pain. No other injury. No numbness or weakness. Patient ate breakfast but has not had lunch.  Past Medical History  Diagnosis Date  . Priapism    Past Surgical History  Procedure Laterality Date  . Appendectomy     No family history on file. Social History  Substance Use Topics  . Smoking status: Never Smoker   . Smokeless tobacco: Never Used  . Alcohol Use: Yes     Comment: occ.    Review of Systems  Constitutional: Negative for appetite change.  Respiratory: Negative for shortness of breath.   Cardiovascular: Negative for chest pain.  Musculoskeletal:       Left shoulder pain  Neurological: Negative for weakness and numbness.      Allergies  Review of patient's allergies indicates no known allergies.  Home Medications   Prior to Admission medications   Medication Sig Start Date End Date Taking? Authorizing Provider  oxyCODONE-acetaminophen (PERCOCET/ROXICET) 5-325 MG tablet Take 1-2 tablets by mouth every 6 (six) hours as needed for severe pain. 02/11/15   Benjiman Core, MD  traMADol (ULTRAM) 50 MG tablet Take 1 tablet (50 mg total) by mouth every 6 (six) hours as needed. Patient not taking: Reported on 02/11/2015 09/22/14   Teressa Lower, NP   BP 114/48 mmHg  Pulse 72  Temp(Src) 97.5 F (36.4 C) (Oral)  Resp 26  Ht  (1.753 m)  Wt 145 lb (65.772 kg)  BMI 21.40 kg/m2  SpO2 100% Physical Exam   Constitutional: He appears well-developed.  Patient appears uncomfortable  Cardiovascular: Normal rate.   Abdominal: Soft. There is no tenderness.  Musculoskeletal:  Tenderness to left shoulder anteriorly with squaring the shoulder. Patient is holding his arm around her friends help with the other hand. Axillary sensation intact. Strong radial pulse.  Neurological: He is alert.  Skin: Skin is warm.    ED Course  ORTHOPEDIC INJURY TREATMENT Date/Time: 02/11/2015 2:56 PM Performed by: Benjiman Core Authorized by: Benjiman Core Consent: Verbal consent obtained. Written consent not obtained. Risks and benefits: risks, benefits and alternatives were discussed Consent given by: patient and parent Patient understanding: patient states understanding of the procedure being performed Relevant documents: relevant documents present and verified Site marked: the operative site was marked Imaging studies: imaging studies available Required items: required blood products, implants, devices, and special equipment available Patient identity confirmed: verbally with patient, arm band and provided demographic data Time out: Immediately prior to procedure a "time out" was called to verify the correct patient, procedure, equipment, support staff and site/side marked as required. Injury location: shoulder Location details: left shoulder Injury type: dislocation Pre-procedure neurovascular assessment: neurovascularly intact Pre-procedure distal perfusion: normal Pre-procedure neurological function: normal Pre-procedure range of motion: reduced Local anesthesia used: no Patient sedated: yes Sedation type: moderate (conscious) sedation Sedatives: propofol Vitals: Vital signs were monitored during sedation. (5 minutes of  sedation) Manipulation performed: yes Reduction method: traction and counter traction Reduction successful: yes X-ray confirmed reduction: yes Immobilization:  sling Post-procedure neurovascular assessment: post-procedure neurovascularly intact Post-procedure distal perfusion: normal Post-procedure neurological function: normal Post-procedure range of motion: improved Patient tolerance: Patient tolerated the procedure well with no immediate complications   (including critical care time) Labs Review Labs Reviewed - No data to display  Imaging Review Dg Shoulder Left  02/11/2015  CLINICAL DATA:  Dislocated left shoulder EXAM: LEFT SHOULDER - 2+ VIEW COMPARISON:  None. FINDINGS: There is anterior dislocation of the left humeral head relative to the glenoid fossa. No fracture line or displaced fracture fragment seen. IMPRESSION: Anterior dislocation of the left humeral head.  No fracture seen. Electronically Signed   By: Bary Richard M.D.   On: 02/11/2015 12:52   Dg Shoulder Left Port  02/11/2015  CLINICAL DATA:  Postreduction for anterior dislocation EXAM: LEFT SHOULDER - 2 VIEW COMPARISON:  Study obtained earlier in the day FINDINGS: Frontal and Y scapular images obtained. The previously noted anterior dislocation has been reduced successfully. Currently no fracture or dislocation. The joint spaces appear normal. IMPRESSION: Successful reduction of anterior dislocation. Currently no fracture or dislocation. No appreciable arthropathic change. Electronically Signed   By: Bretta Bang III M.D.   On: 02/11/2015 15:08   I have personally reviewed and evaluated these images and lab results as part of my medical decision-making.   EKG Interpretation None      MDM   Final diagnoses:  Shoulder dislocation, left, initial encounter    Patient with recurrent left shoulder dislocation. Reduced under procedural sedation. Feels better after treatment with some persistent  pain. Will discharge home with ortho follow up.    Benjiman Core, MD 02/11/15 1520

## 2015-02-11 NOTE — ED Notes (Signed)
Patient back to baseline, A&O x4, maintaining O2 sats, speaking in complete sentences, tolerating PO fluids.

## 2015-02-11 NOTE — Discharge Instructions (Signed)
Shoulder Dislocation °A shoulder dislocation happens when the upper arm bone (humerus) moves out of the shoulder joint. The shoulder joint is the part of the shoulder where the humerus, shoulder blade (scapula), and collarbone (clavicle) meet. °CAUSES °This condition is often caused by: °· A fall. °· A hit to the shoulder. °· A forceful movement of the shoulder. °RISK FACTORS °This condition is more likely to develop in people who play sports. °SYMPTOMS °Symptoms of this condition include: °· Deformity of the shoulder. °· Intense pain. °· Inability to move the shoulder. °· Numbness, weakness, or tingling in your neck or down your arm. °· Bruising or swelling around your shoulder. °DIAGNOSIS °This condition is diagnosed with a physical exam. After the exam, tests may be done to check for related problems. Tests that may be done include: °· X-ray. This may be done to check for broken bones. °· MRI. This may be done to check for damage to the tissues around the shoulder. °· Electromyogram. This may be done to check for nerve damage. °TREATMENT °This condition is treated with a procedure to place the humerus back in the joint. This procedure is called a reduction. There are two types of reduction: °· Closed reduction. In this procedure, the humerus is placed back in the joint without surgery. The health care provider uses his or her hands to guide the bone back into place. °· Open reduction. In this procedure, the humerus is placed back in the joint with surgery. An open reduction may be recommended if: °¨ You have a weak shoulder joint or weak ligaments. °¨ You have had more than one shoulder dislocation. °¨ The nerves or blood vessels around your shoulder have been damaged. °After the humerus is placed back into the joint, your arm will be placed in a splint or sling to prevent it from moving. You will need to wear the splint or sling until your shoulder heals. When the splint or sling is removed, you may have  physical therapy to help improve the range of motion in your shoulder joint. °HOME CARE INSTRUCTIONS °If You Have a Splint or Sling: °· Wear it as told by your health care provider. Remove it only as told by your health care provider. °· Loosen it if your fingers become numb and tingle, or if they turn cold and blue. °· Keep it clean and dry. °Bathing °· Do not take baths, swim, or use a hot tub until your health care provider approves. Ask your health care provider if you can take showers. You may only be allowed to take sponge baths for bathing. °· If your health care provider approves bathing and showering, cover your splint or sling with a watertight plastic bag to protect it from water. Do not let the splint or sling get wet. °Managing Pain, Stiffness, and Swelling °· If directed, apply ice to the injured area. °¨ Put ice in a plastic bag. °¨ Place a towel between your skin and the bag. °¨ Leave the ice on for 20 minutes, 2-3 times per day. °· Move your fingers often to avoid stiffness and to decrease swelling. °· Raise (elevate) the injured area above the level of your heart while you are sitting or lying down. °Driving °· Do not drive while wearing a splint or sling on a hand that you use for driving. °· Do not drive or operate heavy machinery while taking pain medicine. °Activity °· Return to your normal activities as told by your health care provider. Ask your   health care provider what activities are safe for you. °· Perform range-of-motion exercises only as told by your health care provider. °· Exercise your hand by squeezing a soft ball. This helps to decrease stiffness and swelling in your hand and wrist. °General Instructions °· Take over-the-counter and prescription medicines only as told by your health care provider. °· Do not use any tobacco products, including cigarettes, chewing tobacco, or e-cigarettes. Tobacco can delay bone and tissue healing. If you need help quitting, ask your health care  provider. °· Keep all follow-up visits as told by your health care provider. This is important. °SEEK MEDICAL CARE IF: °· Your splint or sling gets damaged. °SEEK IMMEDIATE MEDICAL CARE IF: °· Your pain gets worse rather than better. °· You lose feeling in your arm or hand. °· Your arm or hand becomes white and cold. °  °This information is not intended to replace advice given to you by your health care provider. Make sure you discuss any questions you have with your health care provider. °  °Document Released: 09/27/2000 Document Revised: 09/23/2014 Document Reviewed: 04/27/2014 °Elsevier Interactive Patient Education ©2016 [employer] ° °

## 2015-02-11 NOTE — ED Notes (Signed)
Ortho tech paged for sedation.

## 2015-02-11 NOTE — ED Notes (Signed)
Patient presents from home via EMS for left shoulder dislocation, history of same, obvious deformity to same.   20g left forearm, fentanyl  Last VS: 130/90, 100%ra, 80hr.

## 2015-02-11 NOTE — ED Notes (Signed)
Patient transported to X-ray 

## 2015-02-11 NOTE — ED Notes (Signed)
MD at bedside. 

## 2015-02-11 NOTE — ED Notes (Signed)
Bed: WA20 Expected date:  Expected time:  Means of arrival:  Comments: EMS- shoulder dislocation

## 2015-02-24 ENCOUNTER — Encounter: Payer: Self-pay | Admitting: Family Medicine

## 2015-02-24 ENCOUNTER — Ambulatory Visit (INDEPENDENT_AMBULATORY_CARE_PROVIDER_SITE_OTHER): Payer: Self-pay | Admitting: Family Medicine

## 2015-02-24 VITALS — BP 113/76 | HR 61 | Ht 70.0 in | Wt 145.0 lb

## 2015-02-24 DIAGNOSIS — S43005A Unspecified dislocation of left shoulder joint, initial encounter: Secondary | ICD-10-CM

## 2015-02-24 NOTE — Patient Instructions (Signed)
Get the [facility] coverage - call us when this goes through and we will go ahead with an MRI. Continue with sling. Ice the shoulder 15 minutes at a time 3-4 times a day. Ibuprofen or aleve as needed for pain and inflammation.

## 2015-02-25 DIAGNOSIS — S43005A Unspecified dislocation of left shoulder joint, initial encounter: Secondary | ICD-10-CM | POA: Insufficient documentation

## 2015-02-25 NOTE — Progress Notes (Signed)
PCP: No primary care provider on file.  Subjective:   HPI: Patient is a 30 y.o. male here for left shoulder dislocation.  Patient reports since 2008 he has had at least 6 episodes where left shoulder has popped out of place. Most of the time he can put this back in himself though he couldn't on 2/3 when it popped out while he was sleeping. Went to ED and they relocated his shoulder. Pain level now 1/10, dull lateral shoulder. Wearing a sling. Has not had an MRI, physical therapy, discussed surgery previously. No skin changes, fever, other complaints.  Past Medical History  Diagnosis Date  . Priapism     Current Outpatient Prescriptions on File Prior to Visit  Medication Sig Dispense Refill  . oxyCODONE-acetaminophen (PERCOCET/ROXICET) 5-325 MG tablet Take 1-2 tablets by mouth every 6 (six) hours as needed for severe pain. 15 tablet 0  . traMADol (ULTRAM) 50 MG tablet Take 1 tablet (50 mg total) by mouth every 6 (six) hours as needed. (Patient not taking: Reported on 02/11/2015) 15 tablet 0   No current facility-administered medications on file prior to visit.    Past Surgical History  Procedure Laterality Date  . Appendectomy      No Known Allergies  Social History   Social History  . Marital Status: Single    Spouse Name: N/A  . Number of Children: N/A  . Years of Education: N/A   Occupational History  . Not on file.   Social History Main Topics  . Smoking status: Never Smoker   . Smokeless tobacco: Never Used  . Alcohol Use: 0.0 oz/week    0 Standard drinks or equivalent per week     Comment: occ.  . Drug Use: Yes    Special: Marijuana  . Sexual Activity: Yes   Other Topics Concern  . Not on file   Social History Narrative    No family history on file.  BP 113/76 mmHg  Pulse 61  Ht  (1.778 m)  Wt 145 lb (65.772 kg)  BMI 20.81 kg/m2  Review of Systems: See HPI above.    Objective:  Physical Exam:  Gen: NAD, comfortable in exam  room.  Left shoulder: No swelling, ecchymoses.  No gross deformity. No TTP. Full ER and flexion.  Did not test abduction past 90 degrees. Negative Hawkins, Neers. Negative Yergasons. Strength 5/5 with empty can and resisted internal/external rotation. Positive apprehension, sulcus. NV intact distally.    Right shoulder: FROM without pain.  Assessment & Plan:  1. 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  Independently reviewed radiographs - no bony bankart visible.  Continue with sling, icing, nsaids.  Strongly advised him to get cone coverage so we can go ahead with MRI of his shoulder - can consider an arthrogram as well.  Given severe instability will refer to orthopedics as well after this to discuss operative repair.

## 2015-02-25 NOTE — Assessment & Plan Note (Signed)
this is recurrent - describes several subluxations and at least one dislocation.  Independently reviewed radiographs - no bony bankart visible.  Continue with sling, icing, nsaids.  Strongly advised him to get cone coverage so we can go ahead with MRI of his shoulder - can consider an arthrogram as well.  Given severe instability will refer to orthopedics as well after this to discuss operative repair.

## 2015-03-12 ENCOUNTER — Ambulatory Visit: Payer: Self-pay

## 2015-03-22 ENCOUNTER — Ambulatory Visit: Payer: Self-pay

## 2015-04-14 ENCOUNTER — Telehealth: Payer: Self-pay | Admitting: Internal Medicine

## 2015-04-14 NOTE — Telephone Encounter (Signed)
APPT. REMINDER CALL, LMTCB °

## 2015-04-15 ENCOUNTER — Ambulatory Visit: Payer: Self-pay

## 2015-04-20 ENCOUNTER — Ambulatory Visit (INDEPENDENT_AMBULATORY_CARE_PROVIDER_SITE_OTHER): Payer: Self-pay | Admitting: Family Medicine

## 2015-04-20 ENCOUNTER — Encounter: Payer: Self-pay | Admitting: Family Medicine

## 2015-04-20 VITALS — BP 116/76 | HR 59 | Ht 70.0 in | Wt 145.0 lb

## 2015-04-20 DIAGNOSIS — S43005A Unspecified dislocation of left shoulder joint, initial encounter: Secondary | ICD-10-CM

## 2015-04-20 DIAGNOSIS — M75102 Unspecified rotator cuff tear or rupture of left shoulder, not specified as traumatic: Secondary | ICD-10-CM

## 2015-04-20 NOTE — Patient Instructions (Signed)
We will go ahead with an MRI on your shoulder. I will call you the business day following this to go over results and next steps.

## 2015-04-21 NOTE — Assessment & Plan Note (Signed)
this is recurrent - describes several subluxations and at least one dislocation.  Has cone coverage now - will go ahead with MRI.

## 2015-04-21 NOTE — Progress Notes (Addendum)
PCP: No primary care provider on file.  Subjective:   HPI: Patient is a 30 y.o. male here for left shoulder dislocation.  2/8: Patient reports since 2008 he has had at least 6 episodes where left shoulder has popped out of place. Most of the time he can put this back in himself though he couldn't on 2/3 when it popped out while he was sleeping. Went to ED and they relocated his shoulder. Pain level now 1/10, dull lateral shoulder. Wearing a sling. Has not had an MRI, physical therapy, discussed surgery previously. No skin changes, fever, other complaints.  4/4: Patient reports he has been avoiding using his left arm since last visit. Holds down at side regularly. Pain currently 0/10 but not testing this. Not doing any exercises. No skin changes, fever, other complaints. Has [facility] Coverage now.  Past Medical History  Diagnosis Date  . Priapism     Current Outpatient Prescriptions on File Prior to Visit  Medication Sig Dispense Refill  . oxyCODONE-acetaminophen (PERCOCET/ROXICET) 5-325 MG tablet Take 1-2 tablets by mouth every 6 (six) hours as needed for severe pain. 15 tablet 0  . traMADol (ULTRAM) 50 MG tablet Take 1 tablet (50 mg total) by mouth every 6 (six) hours as needed. (Patient not taking: Reported on 02/11/2015) 15 tablet 0   No current facility-administered medications on file prior to visit.    Past Surgical History  Procedure Laterality Date  . Appendectomy      No Known Allergies  Social History   Social History  . Marital Status: Single    Spouse Name: N/A  . Number of Children: N/A  . Years of Education: N/A   Occupational History  . Not on file.   Social History Main Topics  . Smoking status: Never Smoker   . Smokeless tobacco: Never Used  . Alcohol Use: 0.0 oz/week    0 Standard drinks or equivalent per week     Comment: occ.  . Drug Use: Yes    Special: Marijuana  . Sexual Activity: Yes   Other Topics Concern  . Not on file   Social  History Narrative    No family history on file.  BP 116/76 mmHg  Pulse 59  Ht 5\' 10"  (1.778 m)  Wt 145 lb (65.772 kg)  BMI 20.81 kg/m2  Review of Systems: See HPI above.    Objective:  Physical Exam:  Gen: NAD, comfortable in exam room.  Left shoulder: No swelling, ecchymoses.  No gross deformity. No TTP. Full ER and IR.  Only tested flexion and abduction to 90 degrees. Negative Hawkins, Neers. Negative Yergasons. Strength 5/5 with empty can and resisted internal/external rotation. Positive apprehension, sulcus. NV intact distally.    Right shoulder: FROM without pain.  Assessment & Plan:  1. 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  Has cone coverage now - will go ahead with MRI.    Addendum:  MRI reviewed and discussed with patient.  Evidence of prior dislocation as expected - severe instability with evidence of two separate labral tears.  Advised we go ahead with orthopedic surgery referral to discuss probable operative intervention vs trial of PT first.

## 2015-04-22 ENCOUNTER — Other Ambulatory Visit: Payer: Self-pay | Admitting: Family Medicine

## 2015-04-22 DIAGNOSIS — M75102 Unspecified rotator cuff tear or rupture of left shoulder, not specified as traumatic: Secondary | ICD-10-CM

## 2015-04-24 ENCOUNTER — Ambulatory Visit (HOSPITAL_BASED_OUTPATIENT_CLINIC_OR_DEPARTMENT_OTHER): Payer: Self-pay

## 2015-04-24 ENCOUNTER — Ambulatory Visit (HOSPITAL_BASED_OUTPATIENT_CLINIC_OR_DEPARTMENT_OTHER)
Admission: RE | Admit: 2015-04-24 | Discharge: 2015-04-24 | Disposition: A | Discharge status: Home / Self Care | Payer: No Typology Code available for payment source | Source: Ambulatory Visit | Attending: Family Medicine | Admitting: Family Medicine

## 2015-04-24 DIAGNOSIS — M75102 Unspecified rotator cuff tear or rupture of left shoulder, not specified as traumatic: Secondary | ICD-10-CM | POA: Insufficient documentation

## 2015-04-28 NOTE — Addendum Note (Signed)
Addended by: Kathi SimpersWISE, Cesily Cuoco F on: 04/28/2015 01:33 PM   Modules accepted: Orders

## 2015-05-05 ENCOUNTER — Telehealth: Payer: Self-pay | Admitting: Family Medicine

## 2015-05-05 NOTE — Telephone Encounter (Signed)
Spoke to patient and gave him information for his appointment.

## 2015-05-07 ENCOUNTER — Other Ambulatory Visit (HOSPITAL_COMMUNITY): Payer: Self-pay | Admitting: Orthopedic Surgery

## 2015-05-07 DIAGNOSIS — M25512 Pain in left shoulder: Secondary | ICD-10-CM

## 2015-05-18 ENCOUNTER — Ambulatory Visit (HOSPITAL_COMMUNITY)
Admission: RE | Admit: 2015-05-18 | Discharge: 2015-05-18 | Disposition: A | Discharge status: Home / Self Care | Payer: Medicaid Other | Source: Ambulatory Visit | Attending: Orthopedic Surgery | Admitting: Orthopedic Surgery

## 2015-05-18 ENCOUNTER — Ambulatory Visit (HOSPITAL_COMMUNITY)
Admission: RE | Admit: 2015-05-18 | Discharge: 2015-05-18 | Disposition: A | Discharge status: Home / Self Care | Payer: Self-pay | Source: Ambulatory Visit | Attending: Orthopedic Surgery | Admitting: Orthopedic Surgery

## 2015-05-18 DIAGNOSIS — M25512 Pain in left shoulder: Secondary | ICD-10-CM | POA: Insufficient documentation

## 2015-05-18 DIAGNOSIS — R937 Abnormal findings on diagnostic imaging of other parts of musculoskeletal system: Secondary | ICD-10-CM | POA: Insufficient documentation

## 2015-05-18 MED ORDER — LIDOCAINE HCL (PF) 1 % IJ SOLN
INTRAMUSCULAR | Status: AC
  Administered 2015-05-18: 5 mL via INTRAMUSCULAR
  Filled 2015-05-18: qty 10

## 2015-05-18 MED ORDER — GADOBENATE DIMEGLUMINE 529 MG/ML IV SOLN
5.0000 mL | Freq: Once | INTRAVENOUS | Status: AC | PRN
  Administered 2015-05-18: 0.05 mL via INTRA_ARTICULAR

## 2015-05-18 MED ORDER — IOHEXOL 180 MG/ML  SOLN
20.0000 mL | Freq: Once | INTRAMUSCULAR | Status: AC | PRN
  Administered 2015-05-18: 20 mL via INTRA_ARTICULAR

## 2015-05-28 ENCOUNTER — Other Ambulatory Visit: Payer: Self-pay | Admitting: Orthopedic Surgery

## 2015-05-31 ENCOUNTER — Encounter (HOSPITAL_COMMUNITY): Payer: Self-pay | Admitting: *Deleted

## 2015-06-01 ENCOUNTER — Ambulatory Visit (HOSPITAL_COMMUNITY): Payer: Medicaid Other | Admitting: Anesthesiology

## 2015-06-01 ENCOUNTER — Ambulatory Visit (HOSPITAL_COMMUNITY)
Admission: RE | Admit: 2015-06-01 | Discharge: 2015-06-01 | Disposition: A | Discharge status: Home / Self Care | Payer: Medicaid Other | Source: Ambulatory Visit | Attending: Orthopedic Surgery | Admitting: Orthopedic Surgery

## 2015-06-01 ENCOUNTER — Encounter (HOSPITAL_COMMUNITY)
Admission: RE | Disposition: A | Discharge status: Home / Self Care | Payer: Self-pay | Source: Ambulatory Visit | Attending: Orthopedic Surgery

## 2015-06-01 ENCOUNTER — Encounter (HOSPITAL_COMMUNITY): Payer: Self-pay | Admitting: *Deleted

## 2015-06-01 DIAGNOSIS — F17290 Nicotine dependence, other tobacco product, uncomplicated: Secondary | ICD-10-CM | POA: Insufficient documentation

## 2015-06-01 DIAGNOSIS — X58XXXA Exposure to other specified factors, initial encounter: Secondary | ICD-10-CM | POA: Diagnosis not present

## 2015-06-01 DIAGNOSIS — S43492A Other sprain of left shoulder joint, initial encounter: Secondary | ICD-10-CM | POA: Insufficient documentation

## 2015-06-01 DIAGNOSIS — M25312 Other instability, left shoulder: Secondary | ICD-10-CM | POA: Diagnosis not present

## 2015-06-01 HISTORY — DX: Sickle-cell trait: D57.3

## 2015-06-01 HISTORY — PX: SHOULDER ARTHROSCOPY WITH LABRAL REPAIR: SHX5691

## 2015-06-01 LAB — COMPREHENSIVE METABOLIC PANEL
ALK PHOS: 40 U/L (ref 38–126)
ALT: 14 U/L — AB (ref 17–63)
AST: 21 U/L (ref 15–41)
Albumin: 3.8 g/dL (ref 3.5–5.0)
Anion gap: 6 (ref 5–15)
BILIRUBIN TOTAL: 0.6 mg/dL (ref 0.3–1.2)
BUN: 12 mg/dL (ref 6–20)
CALCIUM: 8.7 mg/dL — AB (ref 8.9–10.3)
CO2: 26 mmol/L (ref 22–32)
CREATININE: 1.03 mg/dL (ref 0.61–1.24)
Chloride: 106 mmol/L (ref 101–111)
Glucose, Bld: 78 mg/dL (ref 65–99)
Potassium: 3.7 mmol/L (ref 3.5–5.1)
Sodium: 138 mmol/L (ref 135–145)
TOTAL PROTEIN: 6.3 g/dL — AB (ref 6.5–8.1)

## 2015-06-01 SURGERY — ARTHROSCOPY, SHOULDER, WITH GLENOID LABRUM REPAIR
Anesthesia: Regional | Site: Shoulder | Laterality: Left

## 2015-06-01 MED ORDER — LIDOCAINE 2% (20 MG/ML) 5 ML SYRINGE
INTRAMUSCULAR | Status: AC
  Filled 2015-06-01: qty 5

## 2015-06-01 MED ORDER — MIDAZOLAM HCL 2 MG/2ML IJ SOLN
2.0000 mg | Freq: Once | INTRAMUSCULAR | Status: AC
  Administered 2015-06-01: 2 mg via INTRAVENOUS

## 2015-06-01 MED ORDER — MIDAZOLAM HCL 2 MG/2ML IJ SOLN
INTRAMUSCULAR | Status: AC
  Filled 2015-06-01: qty 2

## 2015-06-01 MED ORDER — CEFAZOLIN SODIUM-DEXTROSE 2-4 GM/100ML-% IV SOLN
2.0000 g | INTRAVENOUS | Status: AC
  Administered 2015-06-01: 2 g via INTRAVENOUS
  Filled 2015-06-01: qty 100

## 2015-06-01 MED ORDER — ROCURONIUM BROMIDE 50 MG/5ML IV SOLN
INTRAVENOUS | Status: AC
  Filled 2015-06-01: qty 1

## 2015-06-01 MED ORDER — LIDOCAINE HCL (CARDIAC) 20 MG/ML IV SOLN
INTRAVENOUS | Status: DC | PRN
  Administered 2015-06-01: 40 mg via INTRAVENOUS

## 2015-06-01 MED ORDER — CHLORHEXIDINE GLUCONATE 4 % EX LIQD: 60.0000 mL | Freq: Once | CUTANEOUS | Status: DC

## 2015-06-01 MED ORDER — SODIUM CHLORIDE 0.9 % IR SOLN
Status: DC | PRN
  Administered 2015-06-01 (×6): 3000 mL

## 2015-06-01 MED ORDER — BUPIVACAINE-EPINEPHRINE (PF) 0.5% -1:200000 IJ SOLN
INTRAMUSCULAR | Status: DC | PRN
  Administered 2015-06-01: 30 mL via PERINEURAL

## 2015-06-01 MED ORDER — LACTATED RINGERS IV SOLN
INTRAVENOUS | Status: DC
  Administered 2015-06-01 (×2): via INTRAVENOUS

## 2015-06-01 MED ORDER — SODIUM CHLORIDE 0.9 % IR SOLN
Status: DC | PRN
  Administered 2015-06-01: 1000 mL

## 2015-06-01 MED ORDER — ONDANSETRON HCL 4 MG/2ML IJ SOLN
INTRAMUSCULAR | Status: DC | PRN
  Administered 2015-06-01: 4 mg via INTRAVENOUS

## 2015-06-01 MED ORDER — FENTANYL CITRATE (PF) 250 MCG/5ML IJ SOLN
INTRAMUSCULAR | Status: AC
  Filled 2015-06-01: qty 5

## 2015-06-01 MED ORDER — EPINEPHRINE HCL 1 MG/ML IJ SOLN
INTRAMUSCULAR | Status: DC | PRN
  Administered 2015-06-01: 1 mL

## 2015-06-01 MED ORDER — GLYCOPYRROLATE 0.2 MG/ML IV SOSY
PREFILLED_SYRINGE | INTRAVENOUS | Status: AC
  Filled 2015-06-01: qty 3

## 2015-06-01 MED ORDER — FENTANYL CITRATE (PF) 100 MCG/2ML IJ SOLN
INTRAMUSCULAR | Status: AC
  Administered 2015-06-01: 100 ug via INTRAVENOUS
  Filled 2015-06-01: qty 2

## 2015-06-01 MED ORDER — METHOCARBAMOL 500 MG PO TABS: 500.0000 mg | ORAL_TABLET | Freq: Three times a day (TID) | ORAL | Status: DC | PRN

## 2015-06-01 MED ORDER — SUGAMMADEX SODIUM 200 MG/2ML IV SOLN
INTRAVENOUS | Status: DC | PRN
  Administered 2015-06-01: 150 mg via INTRAVENOUS

## 2015-06-01 MED ORDER — PROPOFOL 10 MG/ML IV BOLUS
INTRAVENOUS | Status: DC | PRN
  Administered 2015-06-01: 200 mg via INTRAVENOUS

## 2015-06-01 MED ORDER — ARTIFICIAL TEARS OP OINT
TOPICAL_OINTMENT | OPHTHALMIC | Status: AC
  Filled 2015-06-01: qty 3.5

## 2015-06-01 MED ORDER — EPINEPHRINE HCL 1 MG/ML IJ SOLN
INTRAMUSCULAR | Status: AC
  Filled 2015-06-01: qty 1

## 2015-06-01 MED ORDER — MIDAZOLAM HCL 2 MG/2ML IJ SOLN
INTRAMUSCULAR | Status: AC
  Administered 2015-06-01: 2 mg via INTRAVENOUS
  Filled 2015-06-01: qty 2

## 2015-06-01 MED ORDER — FENTANYL CITRATE (PF) 100 MCG/2ML IJ SOLN
100.0000 ug | Freq: Once | INTRAMUSCULAR | Status: AC
  Administered 2015-06-01: 100 ug via INTRAVENOUS

## 2015-06-01 MED ORDER — HYDROMORPHONE HCL 1 MG/ML IJ SOLN: 0.2500 mg | INTRAMUSCULAR | Status: DC | PRN

## 2015-06-01 MED ORDER — PROPOFOL 10 MG/ML IV BOLUS
INTRAVENOUS | Status: AC
  Filled 2015-06-01: qty 20

## 2015-06-01 MED ORDER — FENTANYL CITRATE (PF) 100 MCG/2ML IJ SOLN
INTRAMUSCULAR | Status: DC | PRN
  Administered 2015-06-01 (×5): 50 ug via INTRAVENOUS

## 2015-06-01 MED ORDER — ROCURONIUM BROMIDE 100 MG/10ML IV SOLN
INTRAVENOUS | Status: DC | PRN
  Administered 2015-06-01: 50 mg via INTRAVENOUS
  Administered 2015-06-01: 20 mg via INTRAVENOUS

## 2015-06-01 MED ORDER — ONDANSETRON HCL 4 MG/2ML IJ SOLN
INTRAMUSCULAR | Status: AC
  Filled 2015-06-01: qty 2

## 2015-06-01 MED ORDER — BUPIVACAINE HCL (PF) 0.25 % IJ SOLN
INTRAMUSCULAR | Status: DC | PRN
  Administered 2015-06-01: 10 mL

## 2015-06-01 MED ORDER — OXYCODONE-ACETAMINOPHEN 10-325 MG PO TABS: 1.0000 | ORAL_TABLET | Freq: Four times a day (QID) | ORAL | Status: DC | PRN

## 2015-06-01 MED ORDER — BUPIVACAINE HCL (PF) 0.25 % IJ SOLN
INTRAMUSCULAR | Status: AC
  Filled 2015-06-01: qty 30

## 2015-06-01 MED ORDER — SODIUM CHLORIDE 0.9 % IJ SOLN
INTRAMUSCULAR | Status: DC | PRN
  Administered 2015-06-01: 30 mL via INTRAVENOUS

## 2015-06-01 SURGICAL SUPPLY — 79 items
ANCHOR SUT BIOCOMP LK 2.9X12.5 (Anchor) ×9 IMPLANT
BENZOIN TINCTURE PRP APPL 2/3 (GAUZE/BANDAGES/DRESSINGS) ×3 IMPLANT
BIT DRILL TAK (DRILL) IMPLANT
BLADE CUDA 5.5 (BLADE) IMPLANT
BLADE CUTTER GATOR 3.5 (BLADE) ×3 IMPLANT
BLADE GREAT WHITE 4.2 (BLADE) IMPLANT
BLADE GREAT WHITE 4.2MM (BLADE)
BLADE SURG 11 STRL SS (BLADE) ×3 IMPLANT
BUR GATOR 2.9 (BURR) IMPLANT
BUR GATOR 2.9MM (BURR)
BUR OVAL 6.0 (BURR) ×3 IMPLANT
CANNULA 5.75X71 LONG (CANNULA) ×3 IMPLANT
CANNULA SHOULDER 7CM (CANNULA) IMPLANT
CLOSURE WOUND 1/2 X4 (GAUZE/BANDAGES/DRESSINGS) ×1
COVER SURGICAL LIGHT HANDLE (MISCELLANEOUS) ×3 IMPLANT
DRAPE INCISE IOBAN 66X45 STRL (DRAPES) ×6 IMPLANT
DRAPE STERI 35X30 U-POUCH (DRAPES) ×6 IMPLANT
DRAPE U-SHAPE 47X51 STRL (DRAPES) ×6 IMPLANT
DRILL TAK (DRILL)
DRSG PAD ABDOMINAL 8X10 ST (GAUZE/BANDAGES/DRESSINGS) ×9 IMPLANT
DRSG TEGADERM 4X4.75 (GAUZE/BANDAGES/DRESSINGS) ×3 IMPLANT
DURAPREP 26ML APPLICATOR (WOUND CARE) ×3 IMPLANT
ELECT MENISCUS 165MM 90D (ELECTRODE) IMPLANT
ELECT REM PT RETURN 9FT ADLT (ELECTROSURGICAL) ×3
ELECTRODE REM PT RTRN 9FT ADLT (ELECTROSURGICAL) ×1 IMPLANT
FIBERSTICK 2 (SUTURE) ×6 IMPLANT
FILTER STRAW FLUID ASPIR (MISCELLANEOUS) ×3 IMPLANT
GAUZE SPONGE 4X4 12PLY STRL (GAUZE/BANDAGES/DRESSINGS) ×3 IMPLANT
GAUZE XEROFORM 1X8 LF (GAUZE/BANDAGES/DRESSINGS) ×3 IMPLANT
GLOVE BIO SURGEON ST LM GN SZ9 (GLOVE) ×3 IMPLANT
GLOVE BIOGEL PI IND STRL 8 (GLOVE) ×1 IMPLANT
GLOVE BIOGEL PI INDICATOR 8 (GLOVE) ×2
GLOVE SURG ORTHO 8.0 STRL STRW (GLOVE) ×3 IMPLANT
GOWN STRL REUS W/ TWL LRG LVL3 (GOWN DISPOSABLE) ×3 IMPLANT
GOWN STRL REUS W/TWL LRG LVL3 (GOWN DISPOSABLE) ×6
KIT BASIN OR (CUSTOM PROCEDURE TRAY) ×3 IMPLANT
KIT DISPOSABLE PUSHLOCK 2.9MM (KITS) IMPLANT
KIT ROOM TURNOVER OR (KITS) ×3 IMPLANT
LASSO 90 CVE QUICKPAS (DISPOSABLE) ×6 IMPLANT
LASSO CRESCENT QUICKPASS (SUTURE) IMPLANT
LASSO SUT 90 DEGREE (SUTURE) IMPLANT
MANIFOLD NEPTUNE II (INSTRUMENTS) ×3 IMPLANT
NDL SUT 6 .5 CRC .975X.05 MAYO (NEEDLE) ×1 IMPLANT
NEEDLE HYPO 25X1 1.5 SAFETY (NEEDLE) ×3 IMPLANT
NEEDLE MAYO TAPER (NEEDLE) ×2
NEEDLE SPNL 18GX3.5 QUINCKE PK (NEEDLE) ×6 IMPLANT
NS IRRIG 1000ML POUR BTL (IV SOLUTION) ×3 IMPLANT
PACK SHOULDER (CUSTOM PROCEDURE TRAY) ×3 IMPLANT
PAD ARMBOARD 7.5X6 YLW CONV (MISCELLANEOUS) ×6 IMPLANT
SET ARTHROSCOPY TUBING (MISCELLANEOUS) ×3
SET ARTHROSCOPY TUBING LN (MISCELLANEOUS) ×1 IMPLANT
SLING ARM IMMOBILIZER MED (SOFTGOODS) IMPLANT
SPEAR FASTAKII (SLEEVE) IMPLANT
SPONGE LAP 4X18 X RAY DECT (DISPOSABLE) ×6 IMPLANT
STRIP CLOSURE SKIN 1/2X4 (GAUZE/BANDAGES/DRESSINGS) ×2 IMPLANT
SUCTION FRAZIER HANDLE 10FR (MISCELLANEOUS) ×2
SUCTION TUBE FRAZIER 10FR DISP (MISCELLANEOUS) ×1 IMPLANT
SUT ETHILON 3 0 PS 1 (SUTURE) ×9 IMPLANT
SUT FIBERWIRE 2-0 18 17.9 3/8 (SUTURE)
SUT LASSO 45 DEGREE LEFT (SUTURE) IMPLANT
SUT LASSO 45D RIGHT (SUTURE) IMPLANT
SUT PROLENE 3 0 PS 2 (SUTURE) ×3 IMPLANT
SUT VIC AB 0 CT1 27 (SUTURE) ×6
SUT VIC AB 0 CT1 27XBRD ANBCTR (SUTURE) ×2 IMPLANT
SUT VIC AB 1 CT1 27 (SUTURE) ×2
SUT VIC AB 1 CT1 27XBRD ANBCTR (SUTURE) ×1 IMPLANT
SUT VIC AB 2-0 CT1 27 (SUTURE) ×2
SUT VIC AB 2-0 CT1 TAPERPNT 27 (SUTURE) ×1 IMPLANT
SUT VICRYL 0 UR6 27IN ABS (SUTURE) IMPLANT
SUTURE FIBERWR 2-0 18 17.9 3/8 (SUTURE) IMPLANT
SYR 20CC LL (SYRINGE) ×6 IMPLANT
SYR 3ML LL SCALE MARK (SYRINGE) ×3 IMPLANT
SYR TB 1ML LUER SLIP (SYRINGE) ×3 IMPLANT
TAPE LABRALWHITE 1.5X36 (TAPE) ×3 IMPLANT
TAPE SUT LABRALTAP WHT/BLK (SUTURE) ×6 IMPLANT
TOWEL OR 17X24 6PK STRL BLUE (TOWEL DISPOSABLE) ×3 IMPLANT
TOWEL OR 17X26 10 PK STRL BLUE (TOWEL DISPOSABLE) ×3 IMPLANT
WAND HAND CNTRL MULTIVAC 90 (MISCELLANEOUS) IMPLANT
WATER STERILE IRR 1000ML POUR (IV SOLUTION) ×3 IMPLANT

## 2015-06-01 NOTE — Anesthesia Preprocedure Evaluation (Addendum)
Anesthesia Evaluation  Patient identified by MRN, date of birth, ID band Patient awake    Reviewed: Allergy & Precautions, H&P , NPO status , Patient's Chart, lab work & pertinent test results  Airway Mallampati: II  TM Distance: >3 FB Neck ROM: Full    Dental no notable dental hx. (+) Teeth Intact, Dental Advisory Given   Pulmonary Current Smoker,    Pulmonary exam normal breath sounds clear to auscultation       Cardiovascular negative cardio ROS   Rhythm:Regular Rate:Normal     Neuro/Psych negative neurological ROS  negative psych ROS   GI/Hepatic negative GI ROS, Neg liver ROS,   Endo/Other  negative endocrine ROS  Renal/GU negative Renal ROS  negative genitourinary   Musculoskeletal   Abdominal   Peds  Hematology negative hematology ROS (+)   Anesthesia Other Findings   Reproductive/Obstetrics negative OB ROS                           Anesthesia Physical Anesthesia Plan  ASA: II  Anesthesia Plan: General and Regional   Post-op Pain Management: GA combined w/ Regional for post-op pain   Induction: Intravenous  Airway Management Planned: Oral ETT  Additional Equipment:   Intra-op Plan:   Post-operative Plan: Extubation in OR  Informed Consent: I have reviewed the patients History and Physical, chart, labs and discussed the procedure including the risks, benefits and alternatives for the proposed anesthesia with the patient or authorized representative who has indicated his/her understanding and acceptance.   Dental advisory given  Plan Discussed with: CRNA  Anesthesia Plan Comments:         Anesthesia Quick Evaluation  

## 2015-06-01 NOTE — Transfer of Care (Signed)
Immediate Anesthesia Transfer of Care Note  Patient: Shawn Fitzgerald  Procedure(s) Performed: Procedure(s): SHOULDER DIAGNOSTIC OPERATIVE ARTHROSCOPY WITH LABRAL REPAIR (Left)  Patient Location: PACU  Anesthesia Type:General and Regional  Level of Consciousness: awake, alert , oriented and patient cooperative  Airway & Oxygen Therapy: Patient Spontanous Breathing and Patient connected to nasal cannula oxygen  Post-op Assessment: Report given to RN and Post -op Vital signs reviewed and stable  Post vital signs: Reviewed and stable  Last Vitals:  Filed Vitals:   06/01/15 1045 06/01/15 1100  BP:    Pulse: 52 48  Temp:    Resp: 18 18    Last Pain: There were no vitals filed for this visit.    Patients Stated Pain Goal: 5 (06/01/15 0804)  Complications: No apparent anesthesia complications

## 2015-06-01 NOTE — Anesthesia Procedure Notes (Addendum)
Anesthesia Regional Block:  Interscalene brachial plexus block  Pre-Anesthetic Checklist: ,, timeout performed, Correct Patient, Correct Site, Correct Laterality, Correct Procedure, Correct Position, site marked, Risks and benefits discussed, pre-op evaluation,  At surgeon's request and post-op pain management  Laterality: Left  Prep: Maximum Sterile Barrier Precautions used and chloraprep       Needles:  Injection technique: Single-shot  Needle Type: Echogenic Stimulator Needle     Needle Length: 5cm 5 cm Needle Gauge: 22 and 22 G    Additional Needles:  Procedures: ultrasound guided (picture in chart) and nerve stimulator Interscalene brachial plexus block  Nerve Stimulator or Paresthesia:  Response: Biceps response,   Additional Responses:   Narrative:  Start time: 06/01/2015 8:55 AM End time: 06/01/2015 9:05 AM Injection made incrementally with aspirations every 5 mL. Anesthesiologist: Gaynelle AduFITZGERALD, WILLIAM  Additional Notes: 2% Lidocaine skin wheel.    Procedure Name: Intubation Date/Time: 06/01/2015 11:17 AM Performed by: Adonis HousekeeperNGELL, Michalina Calbert M Pre-anesthesia Checklist: Patient identified, Emergency Drugs available, Suction available and Patient being monitored Patient Re-evaluated:Patient Re-evaluated prior to inductionOxygen Delivery Method: Circle system utilized Preoxygenation: Pre-oxygenation with 100% oxygen Intubation Type: IV induction Ventilation: Mask ventilation without difficulty and Oral airway inserted - appropriate to patient size Laryngoscope Size: Mac and 4 Grade View: Grade I Tube type: Oral Tube size: 7.5 mm Number of attempts: 1 Airway Equipment and Method: Stylet Placement Confirmation: ETT inserted through vocal cords under direct vision,  positive ETCO2 and breath sounds checked- equal and bilateral Secured at: 22 cm Tube secured with: Tape Dental Injury: Teeth and Oropharynx as per pre-operative assessment

## 2015-06-01 NOTE — Brief Op Note (Signed)
06/01/2015  1:43 PM  PATIENT:  Jeannetta EllisEric F Morocco  30 y.o. male  PRE-OPERATIVE DIAGNOSIS:  Left Shoulder Labral Tear  POST-OPERATIVE DIAGNOSIS:  Left Shoulder Labral Tear  PROCEDURE:  Procedure(s): SHOULDER DIAGNOSTIC OPERATIVE ARTHROSCOPY WITH LABRAL REPAIR  SURGEON:  Surgeon(s): Cammy CopaScott Carmeron Heady, MD  ASSISTANT: Patrick Jupiterarla Bethune RNFA  ANESTHESIA:   general  EBL: 15 ml    Total I/O In: 1000 [I.V.:1000] Out: -   BLOOD ADMINISTERED: none  DRAINS: none   LOCAL MEDICATIONS USED:  none  SPECIMEN:  No Specimen  COUNTS:  YES  TOURNIQUET:  * No tourniquets in log *  DICTATION: .Other Dictation: Dictation Number ([id number]) [id number]  PLAN OF CARE: discharge to home  PATIENT DISPOSITION:  PACU - hemodynamically stable

## 2015-06-01 NOTE — H&P (Signed)
Shawn Fitzgerald is an 30 y.o. male.   Chief Complaint: Left shoulder instability HPI: Shawn Fitzgerald is a 30 year old patient with left shoulder instability.  Initial dislocation occurred in 2008.  His had 6 episodes since then with the last 2 episodes requiring emergency room evaluation sedation and relocation.  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  MRI scanning does show tear of the anterior inferior labrum extending from 3 o'clock to 6 o'clock position.  He is failed conservative management and presents now for operative management after expansion risk benefits  Past Medical History  Diagnosis Date  . Priapism     Past Surgical History  Procedure Laterality Date  . Appendectomy      History reviewed. No pertinent family history. Social History:  reports that he has been smoking Cigars.  He has never used smokeless tobacco. He reports that he drinks alcohol. He reports that he uses illicit drugs (Marijuana).  Allergies: No Known Allergies  Medications Prior to Admission  Medication Sig Dispense Refill  . oxyCODONE-acetaminophen (PERCOCET/ROXICET) 5-325 MG tablet Take 1-2 tablets by mouth every 6 (six) hours as needed for severe pain. (Patient taking differently: Take 1-2 tablets by mouth every 6 (six) hours as needed for severe pain. Patient states he does not take any medication) 15 tablet 0  . traMADol (ULTRAM) 50 MG tablet Take 1 tablet (50 mg total) by mouth every 6 (six) hours as needed. (Patient not taking: Reported on 02/11/2015) 15 tablet 0    No results found for this or any previous visit (from the past 48 hour(s)). No results found.  Review of Systems  Constitutional: Negative.   HENT: Negative.   Eyes: Negative.   Respiratory: Negative.   Cardiovascular: Negative.   Gastrointestinal: Negative.   Genitourinary: Negative.   Musculoskeletal: Positive for joint pain.  Skin: Negative.   Neurological: Negative.   Endo/Heme/Allergies:  Negative.   Psychiatric/Behavioral: Negative.     There were no vitals taken for this visit. Physical Exam  Constitutional: He appears well-developed.  HENT:  Head: Normocephalic.  Eyes: Pupils are equal, round, and reactive to light.  Neck: Normal range of motion.  Cardiovascular: Normal rate.   Respiratory: Effort normal.  Neurological: He is alert.  Skin: Skin is warm.  Psychiatric: He has a normal mood and affect.   examination of the left shoulder demonstrates full active and passive range of motion good rotator cuff strength isolated and status x-rays Celsius multiple's testing negative O'Brien's testing no before meals joint tenderness direct palpation less than a centimeter sulcus sign positive apprehension anteriorly on the left compared to posteriorly.  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  He's had multiple episodes of dislocation.  Plan arthroscopy with labral repair risks and benefits discussed with the patient at length in clinic including but limited to infection or vessel damage as well as the potential trade off for some shoulder stiffness in terms of loss of terminal 10-15 of range of motion for stability.  In terms of doing handsprings and back flips not indicated after this surgery in my opinion.  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  Redislocation rate of 5-10% discussed.  All questions answered anticipate outpatient surgery.  Cammy CopaEAN,Camala Talwar SCOTT, MD 06/01/2015, 7:25 AM

## 2015-06-01 NOTE — Progress Notes (Signed)
Lunch relief by D. Rees RN 

## 2015-06-02 ENCOUNTER — Encounter (HOSPITAL_COMMUNITY): Payer: Self-pay | Admitting: Orthopedic Surgery

## 2015-06-02 NOTE — Op Note (Signed)
NAME:  Shawn Fitzgerald, Shawn Fitzgerald              ACCOUNT NO.:  [id number]  MEDICAL RECORD NO.:  [id number]  LOCATION:  [facility]                         FACILITY:  [facility]  PHYSICIAN:  Burnard Bunting, M.D.    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  PROCEDURE:  Left shoulder arthroscopy with labral repair.  SURGEON:  Burnard Bunting, M.D.  ASSISTANT:  Patrick Jupiter, RNFA.  INDICATIONS:  Shawn Fitzgerald is a 30 year old patient, multiple shoulder dislocations, presents for operative management after explanation of risks and benefits.  OPERATIVE FINDINGS: 1. Examination under anesthesia, range of motion, external rotation at     15 degrees with abduction was about 85 degrees.  Forward flexion     was full to 180, isolated glenohumeral abduction was about 120. 2. Diagnostic arthroscopy:     a.     Stable biceps anchor.     b.     Fairly large shear injury on the chondral cartilage of the      humeral head measuring about 1.5 x 1.5 cm.     c.     Intact rotator cuff.     d.     Tear of the anterior-inferior labrum from the 6 o'clock to 9      o'clock position.  The humeral attachment of the glenohumeral      ligaments was generally intact, although somewhat frayed in some      locations.  The patient had under examination also 2+ anterior      instability, 1+ posterior with less than 1 cm of sulcus sign.  PROCEDURE IN DETAIL:  The patient was brought to the operating room where general anesthetic was induced.  Preoperative antibiotics were administered.  Time-out was called.  The patient was placed in lateral position with the right peroneal nerve and right axilla well padded. Arthrex shoulder holder was utilized.  The patient was prescrubbed  with alcohol and Betadine on the arm and shoulder axillary region, then prepped with DuraPrep solution and draped in a sterile manner.  [name] was used to cover the axilla and the filled the drapes peripherally. Time-out was called.  Solution of saline injected into the glenohumeral joint.  Diagnostic arthroscopy was then performed after creating a portal 2 cm medial and inferior to the posterolateral margin of the acromion.  Arm was suspended about 15 pounds of traction and 10 degrees of forward flexion and at 40 degrees of abduction.  Accessory axillary distractor was utilized under about 10-12 pounds of traction.  Following creation of the posterior portal, diagnostic arthroscopy was performed. The patient did have chondral damage on the posterior humeral surface measuring about 1.5 x 1.5 cm.  Rotator cuff was intact.  Anterior inferior labral tear was present from 6 o'clock to 9 o'clock position. Anterior portal was then created under direct visualization.  An osteotome was then used to debride and mobilize the capsular tissue off the glenoid rim.  At this time, a meniscal rasp was  used to prepare the glenoid.  Good preparation of the bony surface was performed.  Three anchors were then placed with a mattress fiber tape placed at the 6:30, 7:30 and 8:30 positions.  Good restoration of the bumper was achieved. The suture passage was achieved using the captain hook approximately 5 mm from the labral articular surface.  At this time, thorough irrigation was performed.  Instruments were removed.  Portals were irrigated and closed using 2-0 Vicryl and 3-0 nylon.  Following the case, the patient had about 60-65 degrees of external rotation with good endpoint. Impervious dressings placed, shoulder immobilizer placed.  The patient tolerated the procedure well without immediate complication, transferred to the recovery room in stable condition.     Burnard BuntingG. Scott Faris Coolman, M.D.     GSD/MEDQ  D:   06/01/2015  T:  06/02/2015  Job:  [id number]

## 2015-06-03 NOTE — Anesthesia Postprocedure Evaluation (Signed)
Anesthesia Post Note  Patient: Shawn Fitzgerald  Procedure(s) Performed: Procedure(s) (LRB): SHOULDER DIAGNOSTIC OPERATIVE ARTHROSCOPY WITH LABRAL REPAIR (Left)  Patient location during evaluation: PACU Anesthesia Type: General Level of consciousness: awake and alert Pain management: pain level controlled Vital Signs Assessment: post-procedure vital signs reviewed and stable Respiratory status: spontaneous breathing, nonlabored ventilation, respiratory function stable and patient connected to nasal cannula oxygen Cardiovascular status: blood pressure returned to baseline and stable Postop Assessment: no signs of nausea or vomiting Anesthetic complications: no    Last Vitals:  Filed Vitals:   06/01/15 1430 06/01/15 1438  BP:  124/82  Pulse: 56 56  Temp:    Resp: 27     Last Pain:  Filed Vitals:   06/02/15 1036  PainSc: 4                  Dallas Torok,JAMES TERRILL

## 2015-06-24 ENCOUNTER — Ambulatory Visit: Payer: No Typology Code available for payment source | Admitting: Physical Therapy

## 2015-06-29 ENCOUNTER — Ambulatory Visit: Payer: Self-pay | Attending: Orthopedic Surgery | Admitting: Physical Therapy

## 2015-06-29 ENCOUNTER — Encounter: Payer: Self-pay | Admitting: Physical Therapy

## 2015-06-29 DIAGNOSIS — R2232 Localized swelling, mass and lump, left upper limb: Secondary | ICD-10-CM | POA: Insufficient documentation

## 2015-06-29 DIAGNOSIS — M25612 Stiffness of left shoulder, not elsewhere classified: Secondary | ICD-10-CM | POA: Insufficient documentation

## 2015-06-29 DIAGNOSIS — M25512 Pain in left shoulder: Secondary | ICD-10-CM | POA: Insufficient documentation

## 2015-06-29 NOTE — Therapy (Signed)
[facility] [address] [place], [place], [address] Phone: [phone]   Fax:  [phone]  Physical Therapy Evaluation  Patient Details  Name: Shawn Fitzgerald MRN: [id number] Date of Birth: 11/24/1985 Referring Provider: G.Scott Dean  Encounter Date: 06/29/2015      PT End of Session - 06/29/15 0842    Visit Number 1   Date for PT Re-Evaluation 08/29/15   PT Start Time 0757   PT Stop Time 0838   PT Time Calculation (min) 41 min   Activity Tolerance Patient tolerated treatment well   Behavior During Therapy [facility] for tasks assessed/performed      Past Medical History  Diagnosis Date  . Priapism   . Sickle cell trait [facility](HCC)     Past Surgical History  Procedure Laterality Date  . Appendectomy    . Shoulder arthroscopy with labral repair Left 06/01/2015    Procedure: SHOULDER DIAGNOSTIC OPERATIVE ARTHROSCOPY WITH LABRAL REPAIR;  Surgeon: Cammy CopaScott Gregory Dean, MD;  Location: [facility];  Service: Orthopedics;  Laterality: Left;    There were no vitals filed for this visit.       Subjective Assessment - 06/29/15 0800    Subjective Patient reports that he hurt his left shoulder in 2008, he reports that he would have the left shoulder sublux occasionally, he reports that in January he had a dislocation.  He underwent a left labral repair on 06/01/15.  He was in the sling until about 2 weeks ago.   Limitations Lifting;House hold activities   Patient Stated Goals have normal motion and strength with minimal pain   Currently in Pain? Yes   Pain Score 0-No pain   Pain Location Shoulder   Pain Orientation Left   Pain Onset More than a month ago   Pain Frequency Intermittent   Aggravating Factors  movements of the left arm/shoulder will increase the pain to 7/10   Pain Relieving Factors rest pain can be 0/10   Effect of Pain on Daily Activities all ADL's are limited            [facility] PT Assessment - 06/29/15 0001    Assessment   Medical Diagnosis left shoulder labral repair   Referring Provider G.Scott Dean   Onset Date/Surgical Date 06/01/15   Hand Dominance Right   Prior Therapy none   Precautions   Precautions Shoulder   Type of Shoulder Precautions Labral repair at 4 week Flexion to 90 degrees, abduction to 60 degrees, ER to 30 degrees   Balance Screen   Has the patient fallen in the past 6 months No   Has the patient had a decrease in activity level because of a fear of falling?  No   Is the patient reluctant to leave their home because of a fear of falling?  No   Home Environment   Additional Comments has a 29 year old he lifts, did some housework   Prior Function   Level of Independence Independent   Vocation Part time employment   Vocation Requirements wash dishes   Leisure lifts weights, did gymnastics   Posture/Postural Control   Posture Comments fwd head, rounded shoulders   ROM / Strength   AROM / PROM / Strength AROM;PROM   AROM   AROM Assessment Site Shoulder   Right/Left Shoulder Left   Left Shoulder Flexion 90 Degrees   Left Shoulder ABduction 60 Degrees   Left Shoulder External Rotation 25 Degrees   Palpation  Palpation comment scar is mobile, mild tenderness                           PT Education - 06/29/15 0841    Education provided Yes   Education Details Has protocol for Labral repair, started HEP with pendulums, AAROM of flexion to 90 degrees and abduction to 60 degrees, ER to 30 degrees   Person(s) Educated Patient   Methods Explanation;Demonstration;Handout   Comprehension Verbalized understanding;Returned demonstration          PT Short Term Goals - 06/29/15 0948    PT SHORT TERM GOAL #1   Title independent with initial HEP   Time 2   Period Weeks   Status New           PT Long Term Goals - 06/29/15 0948    PT LONG TERM GOAL #1   Title understand the protocol and the importance of following it   Time 8   Period Weeks    Status New   PT LONG TERM GOAL #2   Title increase AROM of the left shoulder to WNL's   Time 8   Period Weeks   Status New   PT LONG TERM GOAL #3   Title decrease pain with ADL's to no pain   Time 8   Period Weeks   Status New   PT LONG TERM GOAL #4   Title lift 10# overhead   Time 8   Period Weeks   Status New               Plan - 06/29/15 0945    Clinical Impression Statement Patient with an original shoulder dislocation in 2008.  He reports one eariler this year, he underwent a labral repair on 06/01/15.  He was in the sling for 2 weeks.  Has protocol.  His ROM is at the protocol limits at this time and he does not appear to be in any pain but is unsure about the limitations of the protocol.  Patient reports that he did a lot of gymnastic type activities in the past and wants to do this again, I told him he really needs to speak with the surgeon about anything like that.   Rehab Potential Good   PT Frequency 1x / week   PT Duration 8 weeks   PT Treatment/Interventions ADLs/Self Care Home Management;Ultrasound;Cryotherapy;Electrical Stimulation;Therapeutic activities;Therapeutic exercise;Manual techniques;Patient/family education   PT Next Visit Plan He is doing very well, we will see him in two weeks prior to him seeing MD and we will advance the protocol   Consulted and Agree with Plan of Care Patient      Patient will benefit from skilled therapeutic intervention in order to improve the following deficits and impairments:  Decreased knowledge of precautions, Decreased range of motion, Decreased strength, Increased edema, Increased muscle spasms, Impaired flexibility, Postural dysfunction, Pain, Impaired UE functional use  Visit Diagnosis: Pain in left shoulder - Plan: PT plan of care cert/re-cert  Stiffness of left shoulder, not elsewhere classified - Plan: PT plan of care cert/re-cert  Localized swelling, mass and lump, left upper limb - Plan: PT plan of care  cert/re-cert     Problem List Patient Active Problem List   Diagnosis Date Noted  . Dislocation of left shoulder joint 02/25/2015    Jearld Lesch., PT 06/29/2015, 9:52 AM  [facility] [address] [place], [place], [address]  Phone: [phone]   Fax:  [phone]  Name: Shawn Fitzgerald MRN: [id number] Date of Birth: Mar 15, 1985

## 2015-07-12 ENCOUNTER — Encounter: Payer: Self-pay | Admitting: Physical Therapy

## 2015-07-12 ENCOUNTER — Ambulatory Visit: Payer: No Typology Code available for payment source | Admitting: Physical Therapy

## 2015-07-12 DIAGNOSIS — M25612 Stiffness of left shoulder, not elsewhere classified: Secondary | ICD-10-CM

## 2015-07-12 DIAGNOSIS — R2232 Localized swelling, mass and lump, left upper limb: Secondary | ICD-10-CM

## 2015-07-12 DIAGNOSIS — M25512 Pain in left shoulder: Secondary | ICD-10-CM

## 2015-07-12 NOTE — Therapy (Signed)
[facility] [address] [place], [place], [address] Phone: [phone]   Fax:  [phone]  Physical Therapy Treatment  Patient Details  Name: Shawn Fitzgerald MRN: [id number] Date of Birth: 04/05/1985 Referring Provider: G.Scott Dean  Encounter Date: 07/12/2015      PT End of Session - 07/12/15 1015    Visit Number 2   Date for PT Re-Evaluation 08/29/15   PT Start Time 0939   PT Stop Time 1015   PT Time Calculation (min) 36 min      Past Medical History  Diagnosis Date  . Priapism   . Sickle cell trait [facility](HCC)     Past Surgical History  Procedure Laterality Date  . Appendectomy    . Shoulder arthroscopy with labral repair Left 06/01/2015    Procedure: SHOULDER DIAGNOSTIC OPERATIVE ARTHROSCOPY WITH LABRAL REPAIR;  Surgeon: Cammy CopaScott Gregory Dean, MD;  Location: [facility] OR;  Service: Orthopedics;  Laterality: Left;    There were no vitals filed for this visit.      Subjective Assessment - 07/12/15 0940    Subjective "Everything is going good"   Currently in Pain? No/denies   Pain Score 0-No pain                         [facility] Adult PT Treatment/Exercise - 07/12/15 0001    Exercises   Exercises Shoulder   Shoulder Exercises: Standing   External Rotation Both;10 reps;Theraband  x2   Theraband Level (Shoulder External Rotation) Level 1 (Yellow)   Internal Rotation 10 reps;Left;Theraband  x2   Theraband Level (Shoulder Internal Rotation) Level 1 (Yellow)   Flexion Both;AAROM;10 reps  x2 with cane   ABduction 10 reps;Left;AAROM  Cane, x10   Extension Both;10 reps;Theraband  x2   Theraband Level (Shoulder Extension) Level 2 (Red)   Row 10 reps;Theraband;Both  x2   Theraband Level (Shoulder Row) Level 2 (Red)   Other Standing Exercises Ext AAROM with cane 2x10   Other Standing Exercises IR with cane 2x10; Bicep curls 2lb 2x15   Manual Therapy   Manual Therapy Passive ROM   Manual therapy  comments pt unable to relax at times    Passive ROM L shoulder all directions                  PT Short Term Goals - 06/29/15 0948    PT SHORT TERM GOAL #1   Title independent with initial HEP   Time 2   Period Weeks   Status New           PT Long Term Goals - 06/29/15 0948    PT LONG TERM GOAL #1   Title understand the protocol and the importance of following it   Time 8   Period Weeks   Status New   PT LONG TERM GOAL #2   Title increase AROM of the left shoulder to WNL's   Time 8   Period Weeks   Status New   PT LONG TERM GOAL #3   Title decrease pain with ADL's to no pain   Time 8   Period Weeks   Status New   PT LONG TERM GOAL #4   Title lift 10# overhead   Time 8   Period Weeks   Status New               Plan - 07/12/15 1015    Clinical Impression Statement  Pt ~ 10 minutes late for today's treatment. Pt able to completa all interventions well without reports of increase pain. Pt is guarded with MT and difficulty relaxing. Pt reports some pain at end range with MT   Rehab Potential Good   PT Frequency 1x / week   PT Duration 8 weeks   PT Treatment/Interventions ADLs/Self Care Home Management;Ultrasound;Cryotherapy;Electrical Stimulation;Therapeutic activities;Therapeutic exercise;Manual techniques;Patient/family education   PT Next Visit Plan follow protocol      Patient will benefit from skilled therapeutic intervention in order to improve the following deficits and impairments:  Decreased knowledge of precautions, Decreased range of motion, Decreased strength, Increased edema, Increased muscle spasms, Impaired flexibility, Postural dysfunction, Pain, Impaired UE functional use  Visit Diagnosis: Pain in left shoulder  Stiffness of left shoulder, not elsewhere classified  Localized swelling, mass and lump, left upper limb     Problem List Patient Active Problem List   Diagnosis Date Noted  . Dislocation of left shoulder joint  02/25/2015    Grayce Sessionsonald G Ottilie Wigglesworth, PTA  07/12/2015, 10:18 AM  [facility] [address] [place], [place], [address] Phone: [phone]   Fax:  [phone]  Name: Shawn Fitzgerald MRN: [id number] Date of Birth: 11/14/1985

## 2015-07-19 ENCOUNTER — Ambulatory Visit: Payer: Self-pay | Attending: Orthopedic Surgery | Admitting: Physical Therapy

## 2015-07-19 ENCOUNTER — Encounter: Payer: Self-pay | Admitting: Physical Therapy

## 2015-07-19 DIAGNOSIS — R2232 Localized swelling, mass and lump, left upper limb: Secondary | ICD-10-CM | POA: Insufficient documentation

## 2015-07-19 DIAGNOSIS — M25612 Stiffness of left shoulder, not elsewhere classified: Secondary | ICD-10-CM | POA: Insufficient documentation

## 2015-07-19 DIAGNOSIS — M25512 Pain in left shoulder: Secondary | ICD-10-CM | POA: Insufficient documentation

## 2015-07-19 NOTE — Therapy (Signed)
[facility] [address] [place], [place], [address] Phone: [phone]   Fax:  [phone]  Physical Therapy Treatment  Patient Details  Name: Shawn Fitzgerald MRN: [id number] Date of Birth: 08/06/1985 Referring Provider: G.Scott Dean  Encounter Date: 07/19/2015      PT End of Session - 07/19/15 1512    Visit Number 3   Date for PT Re-Evaluation 08/29/15   PT Start Time 1451   PT Stop Time 1515   PT Time Calculation (min) 24 min   Activity Tolerance Patient tolerated treatment well   Behavior During Therapy [facility] for tasks assessed/performed      Past Medical History  Diagnosis Date  . Priapism   . Sickle cell trait [facility](HCC)     Past Surgical History  Procedure Laterality Date  . Appendectomy    . Shoulder arthroscopy with labral repair Left 06/01/2015    Procedure: SHOULDER DIAGNOSTIC OPERATIVE ARTHROSCOPY WITH LABRAL REPAIR;  Surgeon: Cammy CopaScott Gregory Dean, MD;  Location[facility];  Service: Orthopedics;  Laterality: Left;    There were no vitals filed for this visit.      Subjective Assessment - 07/19/15 1451    Subjective Pt reports that things are going good and he has no pain   Currently in Pain? No/denies   Pain Score 0-No pain                         [facility] Adult PT Treatment/Exercise - 07/19/15 0001    Shoulder Exercises: ROM/Strengthening   UBE (Upper Arm Bike) L2 663frd/3rev   Shoulder Exercises: Power Librarian, academicTower   Other Power Tower Exercises Row & Lats 20 lb 2x15    Manual Therapy   Manual Therapy Passive ROM   Manual therapy comments pt unable to relax at times    Passive ROM L shoulder all directions                  PT Short Term Goals - 06/29/15 0948    PT SHORT TERM GOAL #1   Title independent with initial HEP   Time 2   Period Weeks   Status New           PT Long Term Goals - 06/29/15 0948    PT LONG TERM GOAL #1   Title understand the protocol and the  importance of following it   Time 8   Period Weeks   Status New   PT LONG TERM GOAL #2   Title increase AROM of the left shoulder to WNL's   Time 8   Period Weeks   Status New   PT LONG TERM GOAL #3   Title decrease pain with ADL's to no pain   Time 8   Period Weeks   Status New   PT LONG TERM GOAL #4   Title lift 10# overhead   Time 8   Period Weeks   Status New               Plan - 07/19/15 1513    Clinical Impression Statement Pt 20 minutes late for today's treatment. Pt tolerated today's gym level exercises well. Pt is very tight and guarded with MT, reports some pain at end range of PROM.   Rehab Potential Good   PT Frequency 1x / week   PT Duration 8 weeks   PT Treatment/Interventions ADLs/Self Care Home Management;Ultrasound;Cryotherapy;Electrical Stimulation;Therapeutic activities;Therapeutic exercise;Manual techniques;Patient/family education  PT Next Visit Plan follow protocol      Patient will benefit from skilled therapeutic intervention in order to improve the following deficits and impairments:  Decreased knowledge of precautions, Decreased range of motion, Decreased strength, Increased edema, Increased muscle spasms, Impaired flexibility, Postural dysfunction, Pain, Impaired UE functional use  Visit Diagnosis: Pain in left shoulder  Stiffness of left shoulder, not elsewhere classified  Localized swelling, mass and lump, left upper limb     Problem List Patient Active Problem List   Diagnosis Date Noted  . Dislocation of left shoulder joint 02/25/2015    Grayce Sessionsonald G Pemberton, PTA 07/19/2015, 3:15 PM  [facility] [address] [place], [place], [address] Phone: [phone]   Fax:  [phone]  Name: Shawn Fitzgerald MRN: [id number] Date of Birth: 07/02/1985

## 2015-07-26 ENCOUNTER — Ambulatory Visit: Payer: Self-pay | Admitting: Physical Therapy

## 2015-07-26 ENCOUNTER — Encounter: Payer: Self-pay | Admitting: Physical Therapy

## 2015-07-26 DIAGNOSIS — M25512 Pain in left shoulder: Secondary | ICD-10-CM

## 2015-07-26 DIAGNOSIS — M25612 Stiffness of left shoulder, not elsewhere classified: Secondary | ICD-10-CM

## 2015-07-26 NOTE — Therapy (Signed)
[address] [place], [place], [address] Phone: [phone]   Fax:  [phone]  Physical Therapy Treatment  Patient Details  Name: Shawn Fitzgerald MRN: [id number] Date of Birth: Aug 23, 1985 Referring Provider: G.Scott Dean  Encounter Date: 07/26/2015      PT End of Session - 07/26/15 0837    Visit Number 4   Date for PT Re-Evaluation 08/29/15   PT Start Time 0815   PT Stop Time 0850   PT Time Calculation (min) 35 min      Past Medical History  Diagnosis Date  . Priapism   . Sickle cell trait [facility])     Past Surgical History  Procedure Laterality Date  . Appendectomy    . Shoulder arthroscopy with labral repair Left 06/01/2015    Procedure: SHOULDER DIAGNOSTIC OPERATIVE ARTHROSCOPY WITH LABRAL REPAIR;  Surgeon: Meredith Pel, MD;  Location: [place];  Service: Orthopedics;  Laterality: Left;    There were no vitals filed for this visit.      Subjective Assessment - 07/26/15 0813    Subjective I guess okay ( pt 15 min late)   Currently in Pain? No/denies            [facility] PT Assessment - 07/26/15 0001    AROM   AROM Assessment Site Shoulder  standing   Right/Left Shoulder Left   Left Shoulder Flexion 137 Degrees   Left Shoulder ABduction 115 Degrees   Left Shoulder External Rotation 65 Degrees                     [facility] Adult PT Treatment/Exercise - 07/26/15 0001    Shoulder Exercises: Seated   Other Seated Exercises lat pull and seated row 20# 2 sets 15   Other Seated Exercises chest press with serratus 2 sets 15  15#   Shoulder Exercises: Sidelying   External Rotation Strengthening;Left;10 reps;Weights  2 sets   External Rotation Weight (lbs) 2   ABduction Strengthening;Left;10 reps;Weights  2 sets   ABduction Weight (lbs) 2   Shoulder Exercises: Standing   Internal Rotation Strengthening;Left;20 reps;Theraband   Theraband Level (Shoulder Internal Rotation)  Level 2 (Red)   Flexion AAROM;Strengthening;Both;20 reps;Weights   Shoulder Flexion Weight (lbs) 2   Shoulder Exercises: ROM/Strengthening   UBE (Upper Arm Bike) L 3 75fd/3back   Manual Therapy   Manual Therapy Passive ROM   Manual therapy comments denies pain with MT but very guarded and resisitive to mvmt   Passive ROM left shld                PT Education - 07/26/15 0828    Education provided Yes   Education Details scap stab ext and retraction, ER at neutral,bicep curl,tricep ext RED tband   Person(s) Educated Patient   Methods Explanation;Demonstration;Handout   Comprehension Verbalized understanding;Returned demonstration          PT Short Term Goals - 07/26/15 0829    PT SHORT TERM GOAL #1   Title independent with initial HEP   Status Achieved           PT Long Term Goals - 07/26/15 0829    PT LONG TERM GOAL #1   Title understand the protocol and the importance of following it   Status On-going   PT LONG TERM GOAL #2   Title increase AROM of the left shoulder to WNL's   Status On-going   PT LONG TERM GOAL #  3   Title decrease pain with ADL's to no pain   Status On-going   PT LONG TERM GOAL #4   Title lift 10# overhead   Status On-going               Plan - 07/26/15 0837    Clinical Impression Statement 15 min late. STG met. Improved AROM and tolerance ot ther ex. guarded with PROM and cuing for relaxing, AROM and AA shows better ROM. Issued HEP.   PT Next Visit Plan Follow protocol.      Patient will benefit from skilled therapeutic intervention in order to improve the following deficits and impairments:  Decreased knowledge of precautions, Decreased range of motion, Decreased strength, Increased edema, Increased muscle spasms, Impaired flexibility, Postural dysfunction, Pain, Impaired UE functional use  Visit Diagnosis: Pain in left shoulder  Stiffness of left shoulder, not elsewhere classified     Problem List Patient Active  Problem List   Diagnosis Date Noted  . Dislocation of left shoulder joint 02/25/2015    PAYSEUR,ANGIE PTA  07/26/2015, 8:43 AM  [address] [place], [place], [address] Phone: [phone]   Fax:  [phone]  Name: Shawn Fitzgerald MRN: [id number] Date of Birth: Jul 21, 1985

## 2015-08-02 ENCOUNTER — Ambulatory Visit: Payer: Self-pay | Admitting: Physical Therapy

## 2015-08-03 ENCOUNTER — Ambulatory Visit: Payer: Self-pay | Admitting: Physical Therapy

## 2015-08-03 DIAGNOSIS — M25612 Stiffness of left shoulder, not elsewhere classified: Secondary | ICD-10-CM

## 2015-08-03 DIAGNOSIS — M25512 Pain in left shoulder: Secondary | ICD-10-CM

## 2015-08-03 NOTE — Therapy (Signed)
[facility] [place] [address] [place], [place], [address] Phone: [phone]   Fax:  [phone]  Physical Therapy Treatment  Patient Details  Name: Shawn Fitzgerald MRN: [id number] Date of Birth: May 11, 1985 Referring Provider: G.Scott Dean  Encounter Date: 08/03/2015      PT End of Session - 08/03/15 0939    Visit Number 5   Date for PT Re-Evaluation 08/29/15   PT Start Time 0855   PT Stop Time 0930   PT Time Calculation (min) 35 min   Activity Tolerance Patient tolerated treatment well   Behavior During Therapy [facility] for tasks assessed/performed      Past Medical History  Diagnosis Date  . Priapism   . Sickle cell trait [facility])     Past Surgical History  Procedure Laterality Date  . Appendectomy    . Shoulder arthroscopy with labral repair Left 06/01/2015    Procedure: SHOULDER DIAGNOSTIC OPERATIVE ARTHROSCOPY WITH LABRAL REPAIR;  Surgeon: Cammy Copa, MD;  Location: [facility] OR;  Service: Orthopedics;  Laterality: Left;    There were no vitals filed for this visit.      Subjective Assessment - 08/03/15 0859    Subjective Pt stated no pain today. Reported to Marylene Land that he started doing HEP, however slacked off a bit. Pt was 10 minutes late.    Currently in Pain? No/denies            [facility] PT Assessment - 08/03/15 0001    AROM   AROM Assessment Site Shoulder   Right/Left Shoulder Left   Left Shoulder Flexion 142 Degrees   Left Shoulder ABduction 111 Degrees   Left Shoulder Internal Rotation 58 Degrees   Left Shoulder External Rotation 70 Degrees                     [facility] Adult PT Treatment/Exercise - 08/03/15 0001    Shoulder Exercises: Seated   Other Seated Exercises Seated push ups using 8# weights for leverage, 3 sec hold, 2 sets of 10   Other Seated Exercises seated rows and lat pull downs 20# 2 sets of 15   Shoulder Exercises: Sidelying   External Rotation 20  reps;Left;Strengthening;Weights   External Rotation Weight (lbs) 2   ABduction Strengthening;Left;20 reps;Weights   ABduction Weight (lbs) 2   Shoulder Exercises: Standing   Internal Rotation Strengthening;Left;20 reps;Theraband   Internal Rotation Weight (lbs) 5   Flexion Strengthening;Left;15 reps;Theraband   Theraband Level (Shoulder Flexion) Level 2 (Red)   ABduction Strengthening;Left;15 reps;Theraband   Theraband Level (Shoulder ABduction) Level 2 (Red)   ABduction Limitations Strengthening, red tband, scaption 15 reps    Other Standing Exercises 5# LUE only forward punches 2 sets of 10   Shoulder Exercises: ROM/Strengthening   UBE (Upper Arm Bike) L 3 18fwd/3back   Manual Therapy   Manual Therapy Passive ROM   Manual therapy comments denies pain, less guarded, expressed some fear of re-injury   Passive ROM left shld                  PT Short Term Goals - 07/26/15 0829    PT SHORT TERM GOAL #1   Title independent with initial HEP   Status Achieved           PT Long Term Goals - 08/03/15 2440    PT LONG TERM GOAL #1   Title understand the protocol and the importance of following it   Status On-going  PT LONG TERM GOAL #2   Title increase AROM of the left shoulder to WNL's   PT LONG TERM GOAL #3   Title decrease pain with ADL's to no pain   Status On-going   PT LONG TERM GOAL #4   Title lift 10# overhead   Status On-going               Plan - 08/03/15 0942    Clinical Impression Statement Pt was 10 min late. Pt had improved AROM from last visit, however lost a few degrees of shoulder abduction. Pt needed some cueing to relax during manual therapy, but was able to relax eventually.    Rehab Potential Good   PT Frequency 1x / week   PT Duration 8 weeks   PT Next Visit Plan Work on regaining AROM of L shoulder abduction, continue strengthening exercises.       Patient will benefit from skilled therapeutic intervention in order to improve the  following deficits and impairments:  Decreased knowledge of precautions, Decreased range of motion, Decreased strength, Increased edema, Increased muscle spasms, Impaired flexibility, Postural dysfunction, Pain, Impaired UE functional use  Visit Diagnosis: Pain in left shoulder  Stiffness of left shoulder, not elsewhere classified     Problem List Patient Active Problem List   Diagnosis Date Noted  . Dislocation of left shoulder joint 02/25/2015    Justus MemoryBrianna Doran Nestle, SPTA 08/03/2015, 9:54 AM  [facility] [place] [address] [place], [place], [address] Phone: [phone]   Fax:  [phone]  Name: Shawn Fitzgerald MRN: [id number] Date of Birth: 12/20/1985

## 2015-08-09 ENCOUNTER — Ambulatory Visit: Payer: Self-pay | Admitting: Physical Therapy

## 2015-08-09 ENCOUNTER — Encounter: Payer: Self-pay | Admitting: Physical Therapy

## 2015-08-09 DIAGNOSIS — M25612 Stiffness of left shoulder, not elsewhere classified: Secondary | ICD-10-CM

## 2015-08-09 DIAGNOSIS — R2232 Localized swelling, mass and lump, left upper limb: Secondary | ICD-10-CM

## 2015-08-09 DIAGNOSIS — M25512 Pain in left shoulder: Secondary | ICD-10-CM

## 2015-08-09 NOTE — Therapy (Signed)
[facility] [place] [address] [place], [place], [address] Phone: [phone]   Fax:  [phone]  Physical Therapy Treatment  Patient Details  Name: Shawn Fitzgerald MRN: [id number] Date of Birth: July 28, 1985 Referring Provider: G.Scott Dean  Encounter Date: 08/09/2015      PT End of Session - 08/09/15 1012    Visit Number 6   Date for PT Re-Evaluation 08/29/15   PT Start Time 0938   PT Stop Time 1015   PT Time Calculation (min) 37 min   Activity Tolerance Patient tolerated treatment well   Behavior During Therapy [facility] for tasks assessed/performed      Past Medical History:  Diagnosis Date  . Priapism   . Sickle cell trait [facility])     Past Surgical History:  Procedure Laterality Date  . APPENDECTOMY    . SHOULDER ARTHROSCOPY WITH LABRAL REPAIR Left 06/01/2015   Procedure: SHOULDER DIAGNOSTIC OPERATIVE ARTHROSCOPY WITH LABRAL REPAIR;  Surgeon: Cammy Copa, MD;  Location: [facility];  Service: Orthopedics;  Laterality: Left;    There were no vitals filed for this visit.      Subjective Assessment - 08/09/15 0939    Subjective "Its doing better far as range of motion"   Currently in Pain? No/denies   Pain Score 0-No pain                         [facility] Adult PT Treatment/Exercise - 08/09/15 0001      Shoulder Exercises: Seated   Other Seated Exercises Chest press 10lb 2x15   Other Seated Exercises seated rows and lat pull downs 25# 3 sets of 15     Shoulder Exercises: Standing   External Rotation 20 reps;Strengthening;Left   Theraband Level (Shoulder External Rotation) Level 2 (Red)   Internal Rotation Strengthening;Left;20 reps;Theraband   Theraband Level (Shoulder Internal Rotation) Level 2 (Red)   Other Standing Exercises OHP yellow ball 2x15; Shrugs 8lb 2x15   Other Standing Exercises 3 level cabinet reaches 3lb x10 flex and abd (no weight); Bicep curl 4lb 2x15     Shoulder Exercises:  ROM/Strengthening   UBE (Upper Arm Bike) L 4 25fwd/3back                  PT Short Term Goals - 07/26/15 0829      PT SHORT TERM GOAL #1   Title independent with initial HEP   Status Achieved           PT Long Term Goals - 08/03/15 6962      PT LONG TERM GOAL #1   Title understand the protocol and the importance of following it   Status On-going     PT LONG TERM GOAL #2   Title increase AROM of the left shoulder to WNL's     PT LONG TERM GOAL #3   Title decrease pain with ADL's to no pain   Status On-going     PT LONG TERM GOAL #4   Title lift 10# overhead   Status On-going               Plan - 08/09/15 1013    Clinical Impression Statement Pt ~8 minutes late for today's treatment, Pt tolerated all activities well, minor shoulder elevation with L shoulder active flex and abduction. Pt unable to perform 3 level cabinet reaches with shoulder abduction under resistance. Pt does demo weakness with L shoulder external rotation.  Rehab Potential Good   PT Frequency 1x / week   PT Duration 8 weeks   PT Treatment/Interventions ADLs/Self Care Home Management;Ultrasound;Cryotherapy;Electrical Stimulation;Therapeutic activities;Therapeutic exercise;Manual techniques;Patient/family education   PT Next Visit Plan Work on regaining AROM of L shoulder abduction, continue strengthening exercises.       Patient will benefit from skilled therapeutic intervention in order to improve the following deficits and impairments:  Decreased knowledge of precautions, Decreased range of motion, Decreased strength, Increased edema, Increased muscle spasms, Impaired flexibility, Postural dysfunction, Pain, Impaired UE functional use  Visit Diagnosis: Pain in left shoulder  Stiffness of left shoulder, not elsewhere classified  Localized swelling, mass and lump, left upper limb     Problem List Patient Active Problem List   Diagnosis Date Noted  . Dislocation of left  shoulder joint 02/25/2015    Grayce Sessions 08/09/2015, 10:15 AM  [facility] [place] [address] [place], [place], [address] Phone: [phone]   Fax:  [phone]  Name: Shawn Fitzgerald MRN: [id number] Date of Birth: October 17, 1985

## 2015-08-11 ENCOUNTER — Encounter: Payer: Self-pay | Admitting: Physical Therapy

## 2015-08-11 ENCOUNTER — Ambulatory Visit: Payer: Self-pay | Admitting: Physical Therapy

## 2015-08-11 DIAGNOSIS — M25612 Stiffness of left shoulder, not elsewhere classified: Secondary | ICD-10-CM

## 2015-08-11 DIAGNOSIS — R2232 Localized swelling, mass and lump, left upper limb: Secondary | ICD-10-CM

## 2015-08-11 DIAGNOSIS — M25512 Pain in left shoulder: Secondary | ICD-10-CM

## 2015-08-11 NOTE — Therapy (Signed)
[address] [place], [place], [address] Phone: [phone]   Fax:  [phone]  Physical Therapy Treatment  Patient Details  Name: Shawn Fitzgerald MRN: [id number] Date of Birth: 10/16/1985 Referring Provider: G.Scott Dean  Encounter Date: 08/11/2015      PT End of Session - 08/11/15 1520    Visit Number 7   Date for PT Re-Evaluation 08/29/15   PT Start Time 0488   PT Stop Time 1517   PT Time Calculation (min) 40 min   Activity Tolerance Patient tolerated treatment well   Behavior During Therapy [facility] for tasks assessed/performed      Past Medical History:  Diagnosis Date  . Priapism   . Sickle cell trait [facility])     Past Surgical History:  Procedure Laterality Date  . APPENDECTOMY    . SHOULDER ARTHROSCOPY WITH LABRAL REPAIR Left 06/01/2015   Procedure: SHOULDER DIAGNOSTIC OPERATIVE ARTHROSCOPY WITH LABRAL REPAIR;  Surgeon: Meredith Pel, MD;  Location: [place];  Service: Orthopedics;  Laterality: Left;    There were no vitals filed for this visit.      Subjective Assessment - 08/11/15 1439    Subjective Pt enters clinic reporting some pain in the posterior shoulder.    Currently in Pain? Yes   Pain Score 7    Pain Location Shoulder                         [facility] Adult PT Treatment/Exercise - 08/11/15 0001      Shoulder Exercises: Seated   Other Seated Exercises Seated bent over rows  4lb 2x15; seated rev flys 3lb 2x10    Other Seated Exercises lat pull downs 25# 3 sets of 10; Chest presses with serratus punches 10lb 2x10     Shoulder Exercises: Standing   External Rotation 20 reps;Strengthening;Left   Theraband Level (Shoulder External Rotation) Level 2 (Red)   Internal Rotation Strengthening;Left;20 reps;Theraband   Theraband Level (Shoulder Internal Rotation) Level 2 (Red)   Flexion Both;15 reps  x2    Shoulder Flexion Weight (lbs) 2   Other Standing Exercises  Standing rows 25lb 2x10   Other Standing Exercises 3 level cabinet reaches abd (no weight); Bicep curl 4lb 2x15     Shoulder Exercises: ROM/Strengthening   UBE (Upper Arm Bike) L 4 38fd/3back     Manual Therapy   Manual Therapy Passive ROM   Manual therapy comments guarded initially with each movement, less guarded as movements ae repeated.   Passive ROM left shld                  PT Short Term Goals - 07/26/15 0829      PT SHORT TERM GOAL #1   Title independent with initial HEP   Status Achieved           PT Long Term Goals - 08/11/15 1445      PT LONG TERM GOAL #1   Title understand the protocol and the importance of following it   Status On-going     PT LONG TERM GOAL #2   Title increase AROM of the left shoulder to WNL's   Status On-going     PT LONG TERM GOAL #3   Title decrease pain with ADL's to no pain   Status Partially Met     PT LONG TERM GOAL #4   Title lift 10# overhead   Status On-going  Plan - 08/11/15 1521    Clinical Impression Statement Pt ~ 8 minutes late for today's treatment. Pt enters clinic with trigger point above L scapula that eased up after trigger point release. Pt able to complete active L shoulder flexion with resistance without shoulder elevation. Pt with some difficulty with seated bent over activities.    Rehab Potential Good   PT Frequency 1x / week   PT Duration 8 weeks   PT Next Visit Plan Work on regaining AROM of L shoulder abduction, continue strengthening exercises.       Patient will benefit from skilled therapeutic intervention in order to improve the following deficits and impairments:  Decreased knowledge of precautions, Decreased range of motion, Decreased strength, Increased edema, Increased muscle spasms, Impaired flexibility, Postural dysfunction, Pain, Impaired UE functional use  Visit Diagnosis: Stiffness of left shoulder, not elsewhere classified  Localized swelling, mass and  lump, left upper limb  Pain in left shoulder     Problem List Patient Active Problem List   Diagnosis Date Noted  . Dislocation of left shoulder joint 02/25/2015    Scot Jun, PTA  08/11/2015, 3:26 PM  [place] [address] [place], [place], [address] Phone: [phone]   Fax:  [phone]  Name: ACHILLE XIANG MRN: [id number] Date of Birth: [birth year]

## 2015-08-17 ENCOUNTER — Ambulatory Visit: Payer: Self-pay | Admitting: Physical Therapy

## 2015-08-19 ENCOUNTER — Ambulatory Visit: Payer: Self-pay | Attending: Orthopedic Surgery | Admitting: Physical Therapy

## 2015-08-19 DIAGNOSIS — M25612 Stiffness of left shoulder, not elsewhere classified: Secondary | ICD-10-CM | POA: Insufficient documentation

## 2015-08-19 DIAGNOSIS — R2232 Localized swelling, mass and lump, left upper limb: Secondary | ICD-10-CM | POA: Insufficient documentation

## 2015-08-19 DIAGNOSIS — M25512 Pain in left shoulder: Secondary | ICD-10-CM | POA: Insufficient documentation

## 2015-08-19 NOTE — Therapy (Signed)
[place] [address] [place], [place], [address] Phone: [phone]   Fax:  [phone]  Physical Therapy Treatment  Patient Details  Name: Shawn Fitzgerald MRN: [id number] Date of Birth: Jul 05, 1985 Referring Provider: G.Scott Dean  Encounter Date: 08/19/2015      PT End of Session - 08/19/15 0927    Visit Number 8   Date for PT Re-Evaluation 08/29/15   PT Start Time 0845   PT Stop Time 0926   PT Time Calculation (min) 41 min   Activity Tolerance Patient tolerated treatment well   Behavior During Therapy [facility] for tasks assessed/performed      Past Medical History:  Diagnosis Date  . Priapism   . Sickle cell trait [facility])     Past Surgical History:  Procedure Laterality Date  . APPENDECTOMY    . SHOULDER ARTHROSCOPY WITH LABRAL REPAIR Left 06/01/2015   Procedure: SHOULDER DIAGNOSTIC OPERATIVE ARTHROSCOPY WITH LABRAL REPAIR;  Surgeon: Meredith Pel, MD;  Location: [place];  Service: Orthopedics;  Laterality: Left;    There were no vitals filed for this visit.      Subjective Assessment - 08/19/15 0847    Subjective Pt reports thst shoulder pain is annoying but not bad this morning.    Limitations Lifting;House hold activities   Patient Stated Goals have normal motion and strength with minimal pain   Currently in Pain? Yes   Pain Score 5    Pain Location Shoulder   Pain Orientation Left                         [facility] Adult PT Treatment/Exercise - 08/19/15 0001      Shoulder Exercises: Seated   Row Strengthening;Both;20 reps;Weights   Row Weight (lbs) 25   Other Seated Exercises lat pull downs 25# 2 sets of 10, LUE stretch with bar x5     Shoulder Exercises: Standing   External Rotation 20 reps;Strengthening;Theraband;Left   Theraband Level (Shoulder External Rotation) Level 2 (Red)   Internal Rotation Strengthening;Left;20 reps;Theraband   Theraband Level (Shoulder Internal  Rotation) Level 2 (Red)   Flexion Strengthening;Left;10 reps;Theraband   Theraband Level (Shoulder Flexion) Level 2 (Red)   ABduction Strengthening;Left;10 reps;Theraband   Theraband Level (Shoulder ABduction) Level 2 (Red)   ABduction Limitations Strengthening, red tband, scaption 10 reps   Other Standing Exercises Standing shoulder rolls fwd/back 2x10 with 2lb weight   Other Standing Exercises OHP with yellow ball x10, cabinet taps to 3rd level x15     Shoulder Exercises: ROM/Strengthening   UBE (Upper Arm Bike) L 2 3 min fwd 3 min back   Wall Wash With towel x15 arc, x10 for abduction   Wall Pushups 20 reps  2 sets     Manual Therapy   Manual Therapy Passive ROM   Manual therapy comments Very guarded with flexion, abduction and ER   Passive ROM Left shld                  PT Short Term Goals - 07/26/15 7616      PT SHORT TERM GOAL #1   Title independent with initial HEP   Status Achieved           PT Long Term Goals - 08/11/15 1445      PT LONG TERM GOAL #1   Title understand the protocol and the importance of following it   Status On-going  PT LONG TERM GOAL #2   Title increase AROM of the left shoulder to WNL's   Status On-going     PT LONG TERM GOAL #3   Title decrease pain with ADL's to no pain   Status Partially Met     PT LONG TERM GOAL #4   Title lift 10# overhead   Status On-going               Plan - 08/19/15 0927    Clinical Impression Statement Pt had some difficulty reaching full ROM for I's, T's and Y's exercise wit red tband. Pt had slight increase in pain during wall slides exercise, but was tolerable. Pt was very guarded during PROM, required cues to relax and take deep breaths.    Rehab Potential Good   PT Frequency 1x / week   PT Duration 8 weeks   PT Treatment/Interventions ADLs/Self Care Home Management;Ultrasound;Cryotherapy;Electrical Stimulation;Therapeutic activities;Therapeutic exercise;Manual  techniques;Patient/family education   PT Next Visit Plan Work on AROM of of L shoudler, continue with strengthening exercises. Implement more wall slides with towel especially for L shoulder abduction.       Patient will benefit from skilled therapeutic intervention in order to improve the following deficits and impairments:  Decreased knowledge of precautions, Decreased range of motion, Decreased strength, Increased edema, Increased muscle spasms, Impaired flexibility, Postural dysfunction, Pain, Impaired UE functional use  Visit Diagnosis: Stiffness of left shoulder, not elsewhere classified  Localized swelling, mass and lump, left upper limb  Pain in left shoulder     Problem List Patient Active Problem List   Diagnosis Date Noted  . Dislocation of left shoulder joint 02/25/2015    Dallie Piles, SPTA 08/19/2015, 9:36 AM  [place] [place] [address] [place], [place], [address] Phone: [phone]   Fax:  [phone]  Name: Shawn Fitzgerald MRN: [id number] Date of Birth: 08/05/1985

## 2015-08-24 ENCOUNTER — Ambulatory Visit: Payer: Self-pay | Admitting: Physical Therapy

## 2015-08-24 ENCOUNTER — Encounter: Payer: Self-pay | Admitting: Physical Therapy

## 2015-08-24 DIAGNOSIS — R2232 Localized swelling, mass and lump, left upper limb: Secondary | ICD-10-CM

## 2015-08-24 DIAGNOSIS — M25512 Pain in left shoulder: Secondary | ICD-10-CM

## 2015-08-24 DIAGNOSIS — M25612 Stiffness of left shoulder, not elsewhere classified: Secondary | ICD-10-CM

## 2015-08-24 NOTE — Therapy (Signed)
[facility] [address] [place], [place], [address] Phone: [phone]   Fax:  [phone]  Physical Therapy Treatment  Patient Details  Name: Shawn Fitzgerald F Chuang MRN: [id number] Date of Birth: 03/05/1985 Referring Provider: G.Scott Dean  Encounter Date: 08/24/2015      PT End of Session - 08/24/15 0921    Visit Number 9   Date for PT Re-Evaluation 08/29/15   PT Start Time 0845   PT Stop Time 0928   PT Time Calculation (min) 43 min   Activity Tolerance Patient tolerated treatment well   Behavior During Therapy [facility] for tasks assessed/performed      Past Medical History:  Diagnosis Date  . Priapism   . Sickle cell trait [facility])     Past Surgical History:  Procedure Laterality Date  . APPENDECTOMY    . SHOULDER ARTHROSCOPY WITH LABRAL REPAIR Left 06/01/2015   Procedure: SHOULDER DIAGNOSTIC OPERATIVE ARTHROSCOPY WITH LABRAL REPAIR;  Surgeon: Cammy CopaScott Gregory Dean, MD;  [facility];  Service: Orthopedics;  Laterality: Left;    There were no vitals filed for this visit.      Subjective Assessment - 08/24/15 0846    Subjective "The pain going away a little"   Currently in Pain? No/denies   Pain Score 0-No pain            [facility] PT Assessment - 08/24/15 0001      AROM   AROM Assessment Site Shoulder   Right/Left Shoulder Left   Left Shoulder Flexion 150 Degrees   Left Shoulder ABduction 110 Degrees                     [facility] Adult PT Treatment/Exercise - 08/24/15 0001      Shoulder Exercises: Standing   External Rotation Strengthening;Theraband;Left;15 reps  x2   Theraband Level (Shoulder External Rotation) Level 2 (Red)   Flexion Strengthening;Left;Theraband;15 reps  x2   Theraband Level (Shoulder Flexion) Level 2 (Red)   ABduction Strengthening;Left;Theraband;15 reps  x2   Theraband Level (Shoulder ABduction) Level 2 (Red)   Other Standing Exercises Standing Rev grip rows 35lb 2x15   Other Standing Exercises 2 level cabinet reaches 2lb flex and abd 2x10     Shoulder Exercises: ROM/Strengthening   UBE (Upper Arm Bike) L 3.5 3 min fwd 3 min back   Wall Wash With towel 2x15 arc   Wall Pushups 20 reps  x2     Shoulder Exercises: Power [employer]   Row 15 reps  x2   Row Limitations 25lb   Other Power Tower Exercises Lat pull downs 35lb 2x15    Other Power Tower Exercises chest press 20lb 2x15                  PT Short Term Goals - 07/26/15 0829      PT SHORT TERM GOAL #1   Title independent with initial HEP   Status Achieved           PT Long Term Goals - 08/24/15 0920      PT LONG TERM GOAL #1   Title understand the protocol and the importance of following it   Status Achieved     PT LONG TERM GOAL #3   Title decrease pain with ADL's to no pain   Status Achieved     PT LONG TERM GOAL #4   Title lift 10# overhead   Status On-going  Plan - 08/24/15 0927    Clinical Impression Statement Pt reports less pain overall, No increase in AROM with R shoulder flexion and abduction but does have good strength with both to 90 deg. Pt with some difficulty with wall wash with LUE performing large arcs.    Rehab Potential Good   PT Frequency 1x / week   PT Duration 8 weeks   PT Treatment/Interventions ADLs/Self Care Home Management;Ultrasound;Cryotherapy;Electrical Stimulation;Therapeutic activities;Therapeutic exercise;Manual techniques;Patient/family education   PT Next Visit Plan Work on AROM of of L shoudler, continue with strengthening exercises. Implement more wall slides with towel especially for L shoulder abduction.       Patient will benefit from skilled therapeutic intervention in order to improve the following deficits and impairments:  Decreased knowledge of precautions, Decreased range of motion, Decreased strength, Increased edema, Increased muscle spasms, Impaired flexibility, Postural dysfunction, Pain, Impaired UE  functional use  Visit Diagnosis: Stiffness of left shoulder, not elsewhere classified  Localized swelling, mass and lump, left upper limb  Pain in left shoulder     Problem List Patient Active Problem List   Diagnosis Date Noted  . Dislocation of left shoulder joint 02/25/2015    Grayce Sessions, PTA  08/24/2015, 9:29 AM  [facility] [place] [address] [place], [place], [address] Phone: [phone]   Fax:  [phone]  Name: MARL SEAGO MRN: [id number] Date of Birth: 1985-06-30

## 2015-08-26 ENCOUNTER — Encounter: Payer: Self-pay | Admitting: Physical Therapy

## 2015-08-26 ENCOUNTER — Ambulatory Visit: Payer: Self-pay | Admitting: Physical Therapy

## 2015-08-26 DIAGNOSIS — R2232 Localized swelling, mass and lump, left upper limb: Secondary | ICD-10-CM

## 2015-08-26 DIAGNOSIS — M25512 Pain in left shoulder: Secondary | ICD-10-CM

## 2015-08-26 DIAGNOSIS — M25612 Stiffness of left shoulder, not elsewhere classified: Secondary | ICD-10-CM

## 2015-08-26 NOTE — Therapy (Signed)
[facility] [address] [place], [place], [address] Phone: [phone]   Fax:  [phone]  Physical Therapy Treatment  Patient Details  Name: Shawn Ellisric F Raffel MRN: [id number] Date of Birth: 07/19/1985 Referring Provider: G.Scott Dean  Encounter Date: 08/26/2015      PT End of Session - 08/26/15 0926    Visit Number 10   Date for PT Re-Evaluation 08/29/15   PT Start Time 0854   PT Stop Time 0929   PT Time Calculation (min) 35 min   Activity Tolerance Patient tolerated treatment well   Behavior During Therapy [facility] for tasks assessed/performed      Past Medical History:  Diagnosis Date  . Priapism   . Sickle cell trait [facility](HCC)     Past Surgical History:  Procedure Laterality Date  . APPENDECTOMY    . SHOULDER ARTHROSCOPY WITH LABRAL REPAIR Left 06/01/2015   Procedure: SHOULDER DIAGNOSTIC OPERATIVE ARTHROSCOPY WITH LABRAL REPAIR;  Surgeon: Cammy CopaScott Gregory Dean, MD;  Location: [facility] OR;  Service: Orthopedics;  Laterality: Left;    There were no vitals filed for this visit.      Subjective Assessment - 08/26/15 0855    Subjective "Good"   Currently in Pain? No/denies   Pain Score 0-No pain                         [facility] Adult PT Treatment/Exercise - 08/26/15 0001      Shoulder Exercises: Seated   Other Seated Exercises LUE only Lat pulldown 20lb 3x15     Shoulder Exercises: Standing   Flexion Strengthening;Left;Theraband;10 reps  x2   Theraband Level (Shoulder Flexion) Level 4 (Blue)   ABduction Strengthening;Left;Theraband;10 reps  x2   Theraband Level (Shoulder ABduction) Level 4 (Blue)   Other Standing Exercises Standing single arm rows # ext LUE 25lb 2x10    Other Standing Exercises 2 level cabinet reaches 2lb flex and abd 2x10; Flex up wall with pillow case 8lb 2x15     Shoulder Exercises: ROM/Strengthening   UBE (Upper Arm Bike) L 3.5 3 min fwd 3 min back     Shoulder Exercises:  Body Blade   Flexion 2 reps;30 seconds   ABduction 2 reps;30 seconds   External Rotation 2 reps;30 seconds   Internal Rotation 2 reps;30 seconds                  PT Short Term Goals - 07/26/15 0829      PT SHORT TERM GOAL #1   Title independent with initial HEP   Status Achieved           PT Long Term Goals - 08/24/15 0920      PT LONG TERM GOAL #1   Title understand the protocol and the importance of following it   Status Achieved     PT LONG TERM GOAL #3   Title decrease pain with ADL's to no pain   Status Achieved     PT LONG TERM GOAL #4   Title lift 10# overhead   Status On-going               Plan - 08/26/15 0927    Clinical Impression Statement Pt ~ 9 minutes late for today's session, tolerated all interventions well and shows good strength with available ROM. Pt able to complete active shoulder motions without shoulder elevation. Some weakness at end range with shoulder flex and abduction.   Rehab  Potential Good   PT Duration 8 weeks   PT Treatment/Interventions ADLs/Self Care Home Management;Ultrasound;Cryotherapy;Electrical Stimulation;Therapeutic activities;Therapeutic exercise;Manual techniques;Patient/family education   PT Next Visit Plan Work on AROM of of L shoudler, continue with strengthening exercises. Implement more wall slides with towel especially for L shoulder abduction.       Patient will benefit from skilled therapeutic intervention in order to improve the following deficits and impairments:  Decreased knowledge of precautions, Decreased range of motion, Decreased strength, Increased edema, Increased muscle spasms, Impaired flexibility, Postural dysfunction, Pain, Impaired UE functional use  Visit Diagnosis: Stiffness of left shoulder, not elsewhere classified  Localized swelling, mass and lump, left upper limb  Pain in left shoulder     Problem List Patient Active Problem List   Diagnosis Date Noted  . Dislocation  of left shoulder joint 02/25/2015    Grayce Sessions, PTA  08/26/2015, 9:29 AM  [facility] [address] [place], [place], [address] Phone: [phone]   Fax:  [phone]  Name: JAMIEON LANNEN MRN: [id number] Date of Birth: December 28, 1985

## 2015-08-30 ENCOUNTER — Ambulatory Visit: Payer: Self-pay | Admitting: Physical Therapy

## 2015-08-30 ENCOUNTER — Encounter: Payer: Self-pay | Admitting: Physical Therapy

## 2015-08-30 DIAGNOSIS — M25612 Stiffness of left shoulder, not elsewhere classified: Secondary | ICD-10-CM

## 2015-08-30 DIAGNOSIS — R2232 Localized swelling, mass and lump, left upper limb: Secondary | ICD-10-CM

## 2015-08-30 DIAGNOSIS — M25512 Pain in left shoulder: Secondary | ICD-10-CM

## 2015-08-30 NOTE — Therapy (Signed)
[facility] [address] [place], [place], [address] Phone: [phone]   Fax:  [phone]  Physical Therapy Treatment  Patient Details  Name: Shawn Fitzgerald MRN: [id number] Date of Birth: 11/20/1985 Referring Provider: G.Scott Dean  Encounter Date: 08/30/2015      PT End of Session - 08/30/15 1231    Visit Number 11   Date for PT Re-Evaluation 08/29/15   PT Start Time 1145   PT Stop Time 1231   PT Time Calculation (min) 46 min   Activity Tolerance Patient tolerated treatment well   Behavior During Therapy [facility] for tasks assessed/performed      Past Medical History:  Diagnosis Date  . Priapism   . Sickle cell trait [facility](HCC)     Past Surgical History:  Procedure Laterality Date  . APPENDECTOMY    . SHOULDER ARTHROSCOPY WITH LABRAL REPAIR Left 06/01/2015   Procedure: SHOULDER DIAGNOSTIC OPERATIVE ARTHROSCOPY WITH LABRAL REPAIR;  Surgeon: Cammy CopaScott Gregory Dean, MD;  Location: [facility] OR;  Service: Orthopedics;  Laterality: Left;    There were no vitals filed for this visit.      Subjective Assessment - 08/30/15 1147    Subjective "Im tired, my arm feel all right, still cant do everything"   Currently in Pain? No/denies   Pain Score 0-No pain            [facility] PT Assessment - 08/30/15 0001      AROM   AROM Assessment Site Shoulder   Right/Left Shoulder Left   Left Shoulder Flexion 154 Degrees   Left Shoulder ABduction 115 Degrees   Left Shoulder Internal Rotation 76 Degrees   Left Shoulder External Rotation 41 Degrees                     [facility] Adult PT Treatment/Exercise - 08/30/15 0001      Shoulder Exercises: Standing   External Rotation Strengthening;Theraband;Left;15 reps   Theraband Level (Shoulder External Rotation) Level 2 (Red)   Row 15 reps;Both;Strengthening  x2   Row Weight (lbs) 35   Other Standing Exercises 2 level cabinet reaches 2lb flex and abd 2x10; Flex up wall with  pillow case 8lb 2x15     Shoulder Exercises: ROM/Strengthening   UBE (Upper Arm Bike) L 3.5 3 min fwd 3 min back   Wall Wash With towel 2x15 arc     Manual Therapy   Manual Therapy Passive ROM   Manual therapy comments PROM increased as pt relaxed, very guarded initially.   Passive ROM Left shld                  PT Short Term Goals - 07/26/15 0829      PT SHORT TERM GOAL #1   Title independent with initial HEP   Status Achieved           PT Long Term Goals - 08/30/15 1235      PT LONG TERM GOAL #1   Title understand the protocol and the importance of following it   Status Achieved     PT LONG TERM GOAL #2   Title increase AROM of the left shoulder to WNL's   Status On-going     PT LONG TERM GOAL #3   Title decrease pain with ADL's to no pain   Status Achieved     PT LONG TERM GOAL #4   Title lift 10# overhead   Status On-going  Plan - 08/30/15 1231    Clinical Impression Statement Pt AROM remains limited mostly with abduction. Good strength with available ROM but he continues to be weak at the end ranges. Minor improvements with AROM. AROM taken in supine position.   Rehab Potential Good   PT Duration 8 weeks   PT Treatment/Interventions ADLs/Self Care Home Management;Ultrasound;Cryotherapy;Electrical Stimulation;Therapeutic activities;Therapeutic exercise;Manual techniques;Patient/family education   PT Next Visit Plan Work on AROM of  L shoulder, continue with strengthening exercises. Implement more wall slides with towel especially for L shoulder abduction.       Patient will benefit from skilled therapeutic intervention in order to improve the following deficits and impairments:  Decreased knowledge of precautions, Decreased range of motion, Decreased strength, Increased edema, Increased muscle spasms, Impaired flexibility, Postural dysfunction, Pain, Impaired UE functional use  Visit Diagnosis: Stiffness of left shoulder, not  elsewhere classified  Localized swelling, mass and lump, left upper limb  Pain in left shoulder     Problem List Patient Active Problem List   Diagnosis Date Noted  . Dislocation of left shoulder joint 02/25/2015    Grayce Sessionsonald G Mauricio Dahlen, PTA  08/30/2015, 12:36 PM  [facility] [address] [place], [place], [address] Phone: [phone]   Fax:  [phone]  Name: Shawn Fitzgerald MRN: [id number] Date of Birth: 11/28/1985

## 2015-09-07 ENCOUNTER — Ambulatory Visit: Payer: Self-pay | Admitting: Physical Therapy

## 2015-09-07 ENCOUNTER — Encounter: Payer: Self-pay | Admitting: Physical Therapy

## 2015-09-07 DIAGNOSIS — M25512 Pain in left shoulder: Secondary | ICD-10-CM

## 2015-09-07 DIAGNOSIS — M25612 Stiffness of left shoulder, not elsewhere classified: Secondary | ICD-10-CM

## 2015-09-07 DIAGNOSIS — R2232 Localized swelling, mass and lump, left upper limb: Secondary | ICD-10-CM

## 2015-09-07 NOTE — Therapy (Signed)
[facility] [address] [place], [place], [address] Phone: [phone]   Fax:  [phone]  Physical Therapy Treatment  Patient Details  Name: Shawn Fitzgerald MRN: [id number] Date of Birth: 01/04/1986 Referring Provider: G.Scott Dean  Encounter Date: 09/07/2015      PT End of Session - 09/07/15 1013    Visit Number 12   Date for PT Re-Evaluation 08/29/15   PT Start Time 1015   PT Stop Time 1014   PT Time Calculation (min) 1439 min   Activity Tolerance Patient tolerated treatment well   Behavior During Therapy [facility] for tasks assessed/performed      Past Medical History:  Diagnosis Date  . Priapism   . Sickle cell trait [facility](HCC)     Past Surgical History:  Procedure Laterality Date  . APPENDECTOMY    . SHOULDER ARTHROSCOPY WITH LABRAL REPAIR Left 06/01/2015   Procedure: SHOULDER DIAGNOSTIC OPERATIVE ARTHROSCOPY WITH LABRAL REPAIR;  Surgeon: Cammy CopaScott Gregory Dean, MD;  [facility] OR;  Service: Orthopedics;  Laterality: Left;    There were no vitals filed for this visit.      Subjective Assessment - 09/07/15 0934    Subjective Reports seeing the MD last week, "Things are looking better"   Currently in Pain? No/denies   Pain Score 0-No pain            [facility] PT Assessment - 09/07/15 0001      AROM   AROM Assessment Site Shoulder   Right/Left Shoulder Left   Left Shoulder Flexion 148 Degrees   Left Shoulder ABduction 125 Degrees   Left Shoulder Internal Rotation 45 Degrees   Left Shoulder External Rotation 60 Degrees                     [facility] Adult PT Treatment/Exercise - 09/07/15 0001      Shoulder Exercises: Seated   Other Seated Exercises seated bent over rows, fly's, then front raises 4lb 2x10 each      Shoulder Exercises: Standing   Other Standing Exercises $ way scap stabe x10,     Shoulder Exercises: ROM/Strengthening   UBE (Upper Arm Bike) L 4 3 min fwd 3 min back   Other  ROM/Strengthening Exercises Chest press 20 lb 2x10 with serratus    Other ROM/Strengthening Exercises Row & lats 35lb 3x10     Manual Therapy   Manual Therapy Passive ROM;Joint mobilization   Manual therapy comments PROM increased as pt relaxed, very guarded initially.   Joint Mobilization Grades 3-4 all directions   Passive ROM Left shld                  PT Short Term Goals - 07/26/15 0829      PT SHORT TERM GOAL #1   Title independent with initial HEP   Status Achieved           PT Long Term Goals - 09/07/15 1018      PT LONG TERM GOAL #2   Title increase AROM of the left shoulder to WNL's   Status On-going     PT LONG TERM GOAL #3   Title decrease pain with ADL's to no pain   Status Achieved     PT LONG TERM GOAL #4   Title lift 10# overhead   Status On-going               Plan - 09/07/15 1013    Clinical  Impression Statement Pt continues to be limited with abduction, AROM again taken in supine position before MT. Completed all exercise interventions well, does fatigue easily with the 4 way scapular stabilization interventions. Some torso compensation with lat pull downs and bent over rows.   Rehab Potential Good   PT Frequency 1x / week   PT Duration 8 weeks   PT Treatment/Interventions ADLs/Self Care Home Management;Ultrasound;Cryotherapy;Electrical Stimulation;Therapeutic activities;Therapeutic exercise;Manual techniques;Patient/family education   PT Next Visit Plan Work on AROM of of L shoulder, continue with strengthening exercises. Implement more wall slides with towel especially for L shoulder abduction.       Patient will benefit from skilled therapeutic intervention in order to improve the following deficits and impairments:  Decreased knowledge of precautions, Decreased range of motion, Decreased strength, Increased edema, Increased muscle spasms, Impaired flexibility, Postural dysfunction, Pain, Impaired UE functional use  Visit  Diagnosis: Localized swelling, mass and lump, left upper limb  Pain in left shoulder  Stiffness of left shoulder, not elsewhere classified     Problem List Patient Active Problem List   Diagnosis Date Noted  . Dislocation of left shoulder joint 02/25/2015    Grayce Sessionsonald G Leanndra Pember, PTA  09/07/2015, 10:23 AM  [facility] [address] [place], [place], [address] Phone: [phone]   Fax:  [phone]  Name: Shawn Fitzgerald MRN: [id number] Date of Birth: 02/28/1985

## 2015-09-07 NOTE — Addendum Note (Signed)
Addended by: Jearld LeschALBRIGHT, MICHAEL W on: 09/07/2015 02:24 PM   Modules accepted: Orders

## 2015-09-09 ENCOUNTER — Ambulatory Visit: Payer: Self-pay | Admitting: Physical Therapy

## 2015-09-09 ENCOUNTER — Encounter: Payer: Self-pay | Admitting: Physical Therapy

## 2015-09-09 DIAGNOSIS — M25612 Stiffness of left shoulder, not elsewhere classified: Secondary | ICD-10-CM

## 2015-09-09 DIAGNOSIS — R2232 Localized swelling, mass and lump, left upper limb: Secondary | ICD-10-CM

## 2015-09-09 DIAGNOSIS — M25512 Pain in left shoulder: Secondary | ICD-10-CM

## 2015-09-09 NOTE — Therapy (Signed)
[facility] [address] [place], [place], [address] Phone: [phone]   Fax:  [phone]  Physical Therapy Treatment  Patient Details  Name: Shawn Fitzgerald MRN: [id number] Date of Birth: 07/05/1985 Referring Provider: G.Scott Dean  Encounter Date: 09/09/2015      PT End of Session - 09/09/15 1012    Visit Number 13   Date for PT Re-Evaluation 09/24/15   PT Start Time 0932   PT Stop Time 1012   PT Time Calculation (min) 40 min   Activity Tolerance Patient tolerated treatment well   Behavior During Therapy [facility] for tasks assessed/performed      Past Medical History:  Diagnosis Date  . Priapism   . Sickle cell trait [facility](HCC)     Past Surgical History:  Procedure Laterality Date  . APPENDECTOMY    . SHOULDER ARTHROSCOPY WITH LABRAL REPAIR Left 06/01/2015   Procedure: SHOULDER DIAGNOSTIC OPERATIVE ARTHROSCOPY WITH LABRAL REPAIR;  Surgeon: Cammy CopaScott Gregory Dean, MD;  [facility] OR;  Service: Orthopedics;  Laterality: Left;    There were no vitals filed for this visit.      Subjective Assessment - 09/09/15 0936    Subjective "Same old"   Currently in Pain? No/denies   Pain Score 0-No pain                         [facility] Adult PT Treatment/Exercise - 09/09/15 0001      Shoulder Exercises: Seated   Other Seated Exercises seated bent over rows, fly's, then front raises 4lb 2x10 each      Shoulder Exercises: Standing   Other Standing Exercises 4 way scap stabe x10 each 2lb,   Other Standing Exercises 3 level cabinet reaches 2lb flex and abd      Shoulder Exercises: ROM/Strengthening   UBE (Upper Arm Bike) L 5.5 3 min fwd 3 min back   Other ROM/Strengthening Exercises Chest press 20 lb 2x15 with serratus    Other ROM/Strengthening Exercises Row & lats 35lb 2x15     Manual Therapy   Manual Therapy Passive ROM;Joint mobilization   Manual therapy comments PROM increased as pt relaxed, very  guarded initially.   Joint Mobilization Grades 3-4 all directions   Passive ROM Left shld                  PT Short Term Goals - 07/26/15 0829      PT SHORT TERM GOAL #1   Title independent with initial HEP   Status Achieved           PT Long Term Goals - 09/07/15 1018      PT LONG TERM GOAL #2   Title increase AROM of the left shoulder to WNL's   Status On-going     PT LONG TERM GOAL #3   Title decrease pain with ADL's to no pain   Status Achieved     PT LONG TERM GOAL #4   Title lift 10# overhead   Status On-going               Plan - 09/09/15 1012    Clinical Impression Statement Difficulty with 4 way scapular stabilization exercises, that consisted of resisted ER and D2 flexion. Pt guarded with MT at end range requires cues to relax. Continues with torso compensation with lat pull downs.   Rehab Potential Good   PT Frequency 1x / week   PT Duration  8 weeks   PT Treatment/Interventions ADLs/Self Care Home Management;Ultrasound;Cryotherapy;Electrical Stimulation;Therapeutic activities;Therapeutic exercise;Manual techniques;Patient/family education   PT Next Visit Plan Work on AROM of of L shoudler, continue with strengthening exercises. Implement more wall slides with towel especially for L shoulder abduction.       Patient will benefit from skilled therapeutic intervention in order to improve the following deficits and impairments:  Decreased knowledge of precautions, Decreased range of motion, Decreased strength, Increased edema, Increased muscle spasms, Impaired flexibility, Postural dysfunction, Pain, Impaired UE functional use  Visit Diagnosis: Localized swelling, mass and lump, left upper limb  Pain in left shoulder  Stiffness of left shoulder, not elsewhere classified     Problem List Patient Active Problem List   Diagnosis Date Noted  . Dislocation of left shoulder joint 02/25/2015    Grayce Sessionsonald G Leshae Mcclay, PTA 09/09/2015, 10:15  AM  [facility] [address] [place], [place], [address] Phone: [phone]   Fax:  [phone]  Name: Shawn Fitzgerald MRN: [id number] Date of Birth: 08/18/1985

## 2015-09-14 ENCOUNTER — Encounter: Payer: Self-pay | Admitting: Physical Therapy

## 2015-09-14 ENCOUNTER — Ambulatory Visit: Payer: Self-pay | Admitting: Physical Therapy

## 2015-09-14 DIAGNOSIS — M25612 Stiffness of left shoulder, not elsewhere classified: Secondary | ICD-10-CM

## 2015-09-14 DIAGNOSIS — M25512 Pain in left shoulder: Secondary | ICD-10-CM

## 2015-09-14 DIAGNOSIS — R2232 Localized swelling, mass and lump, left upper limb: Secondary | ICD-10-CM

## 2015-09-14 NOTE — Therapy (Signed)
[facility] [address] [place], [place], [address] Phone: [phone]   Fax:  [phone]  Physical Therapy Treatment  Patient Details  Name: Shawn Fitzgerald MRN: [id number] Date of Birth: [birth year] Referring Provider: G.Scott Dean  Encounter Date: 09/14/2015      PT End of Session - 09/14/15 1224    Visit Number 14   Date for PT Re-Evaluation 09/24/15   PT Start Time 1145   PT Stop Time 1224   PT Time Calculation (min) 39 min      Past Medical History:  Diagnosis Date  . Priapism   . Sickle cell trait [facility]     Past Surgical History:  Procedure Laterality Date  . APPENDECTOMY    . SHOULDER ARTHROSCOPY WITH LABRAL REPAIR Left 06/01/2015   Procedure: SHOULDER DIAGNOSTIC OPERATIVE ARTHROSCOPY WITH LABRAL REPAIR;  Surgeon: Cammy Copa, MD;  [facility] OR;  Service: Orthopedics;  Laterality: Left;    There were no vitals filed for this visit.      Subjective Assessment - 09/14/15 1146    Subjective "All right"   Currently in Pain? No/denies   Pain Score 0-No pain                         [facility] Adult PT Treatment/Exercise - 09/14/15 0001      Shoulder Exercises: Seated   Other Seated Exercises seated bent over rows, fly's, then front raises 4lb 2x10 each      Shoulder Exercises: Standing   Other Standing Exercises 4 way scap stabe x10 each 2lb; OHP 2lb 2x10; Standing bicep curls 5lb 2x15; Triceps press downs 25lb 2x15   Other Standing Exercises Flexion up wall 8lb dumbbell with pillow case 2x15; # level cabinet reaches 2lb x10 flex and abd     Shoulder Exercises: ROM/Strengthening   UBE (Upper Arm Bike) constant work 30w 1frd/3rev   Other ROM/Strengthening Exercises Chest press 20 lb 2x15 with serratus    Other ROM/Strengthening Exercises Row & lats 35lb 2x15                  PT Short Term Goals - 07/26/15 6962      PT SHORT TERM GOAL #1   Title independent  with initial HEP   Status Achieved           PT Long Term Goals - 09/07/15 1018      PT LONG TERM GOAL #2   Title increase AROM of the left shoulder to WNL's   Status On-going     PT LONG TERM GOAL #3   Title decrease pain with ADL's to no pain   Status Achieved     PT LONG TERM GOAL #4   Title lift 10# overhead   Status On-going               Plan - 09/14/15 1224    Clinical Impression Statement Pt completed all of today's interventions well. Difficulty remains with 4 way scapular stabilization exercises that consisted of D2 flexion and R shoulder ER. Difficulty with OHP initially but able to gain more ROM as he competed additional reps.    Rehab Potential Good   PT Frequency 1x / week   PT Duration 8 weeks   PT Treatment/Interventions ADLs/Self Care Home Management;Ultrasound;Cryotherapy;Electrical Stimulation;Therapeutic activities;Therapeutic exercise;Manual techniques;Patient/family education   PT Next Visit Plan Work on AROM of of L shoudler, continue with strengthening exercises. Implement  more wall slides with towel especially for L shoulder abduction.       Patient will benefit from skilled therapeutic intervention in order to improve the following deficits and impairments:  Decreased knowledge of precautions, Decreased range of motion, Decreased strength, Increased edema, Increased muscle spasms, Impaired flexibility, Postural dysfunction, Pain, Impaired UE functional use  Visit Diagnosis: Pain in left shoulder  Stiffness of left shoulder, not elsewhere classified  Localized swelling, mass and lump, left upper limb     Problem List Patient Active Problem List   Diagnosis Date Noted  . Dislocation of left shoulder joint 02/25/2015    Grayce Sessionsonald G Pemberton, PTA  09/14/2015, 12:26 PM  [facility] [address] [place], [place], [address] Phone: [phone]   Fax:  [phone]  Name: Jeannetta Ellisric  F Hassan MRN: [id number] Date of Birth: 04/16/1985

## 2015-09-16 ENCOUNTER — Encounter: Payer: Self-pay | Admitting: Physical Therapy

## 2015-09-16 ENCOUNTER — Ambulatory Visit: Payer: Self-pay | Admitting: Physical Therapy

## 2015-09-16 DIAGNOSIS — M25612 Stiffness of left shoulder, not elsewhere classified: Secondary | ICD-10-CM

## 2015-09-16 DIAGNOSIS — M25512 Pain in left shoulder: Secondary | ICD-10-CM

## 2015-09-16 NOTE — Therapy (Signed)
[facility] [address] [place], [place], [address] Phone: [phone]   Fax:  [phone]  Physical Therapy Treatment  Patient Details  Name: Shawn Fitzgerald MRN: [id number] Date of Birth: 03/25/1985 Referring Provider: G.Scott Dean  Encounter Date: 09/16/2015      PT End of Session - 09/16/15 1048    Visit Number 15   Date for PT Re-Evaluation 09/24/15   PT Start Time 1015   PT Stop Time 1050   PT Time Calculation (min) 35 min      Past Medical History:  Diagnosis Date  . Priapism   . Sickle cell trait [facility])     Past Surgical History:  Procedure Laterality Date  . APPENDECTOMY    . SHOULDER ARTHROSCOPY WITH LABRAL REPAIR Left 06/01/2015   Procedure: SHOULDER DIAGNOSTIC OPERATIVE ARTHROSCOPY WITH LABRAL REPAIR;  Surgeon: Cammy Copa, MD;  Location: [facility] OR;  Service: Orthopedics;  Laterality: Left;    There were no vitals filed for this visit.      Subjective Assessment - 09/16/15 1016    Subjective doing good-no pain   Currently in Pain? No/denies            [facility] PT Assessment - 09/16/15 0001      AROM   AROM Assessment Site Shoulder   Right/Left Shoulder Left  standing   Left Shoulder Flexion 165 Degrees   Left Shoulder ABduction 158 Degrees   Left Shoulder Internal Rotation 52 Degrees   Left Shoulder External Rotation 80 Degrees                     [facility] Adult PT Treatment/Exercise - 09/16/15 0001      Shoulder Exercises: Standing   Other Standing Exercises pull ups 2 sets 5  tricep ext 2 sets 15   Other Standing Exercises 8# UB circuit     Shoulder Exercises: Pulleys   Other Pulley Exercises 15# horz abd 3 sets 10     Shoulder Exercises: ROM/Strengthening   UBE (Upper Arm Bike) L 6 3 fwd/3back   Other ROM/Strengthening Exercises Chest press 25 lb 2x15 with serratus    Other ROM/Strengthening Exercises seated row 55# 3 sets 10, lat pull 45# 3 sets 10                   PT Short Term Goals - 07/26/15 1324      PT SHORT TERM GOAL #1   Title independent with initial HEP   Status Achieved           PT Long Term Goals - 09/16/15 1049      PT LONG TERM GOAL #1   Title understand the protocol and the importance of following it   Status Achieved     PT LONG TERM GOAL #2   Title increase AROM of the left shoulder to WNL's   Status On-going     PT LONG TERM GOAL #3   Title decrease pain with ADL's to no pain   Status On-going     PT LONG TERM GOAL #4   Title lift 10# overhead   Baseline 8# with weakness noted   Status On-going               Plan - 09/16/15 1049    Clinical Impression Statement Pt did well with increased ther ex and wts today. Some weakness noted with 8# OH but denies pain. MMT R=L 5/5 but  left fatigues quicker. ROM improving towards goal of WNL   PT Next Visit Plan Pt thinks insurance ends next week,looking into gym options      Patient will benefit from skilled therapeutic intervention in order to improve the following deficits and impairments:  Decreased knowledge of precautions, Decreased range of motion, Decreased strength, Increased edema, Increased muscle spasms, Impaired flexibility, Postural dysfunction, Pain, Impaired UE functional use  Visit Diagnosis: Stiffness of left shoulder, not elsewhere classified  Pain in left shoulder     Problem List Patient Active Problem List   Diagnosis Date Noted  . Dislocation of left shoulder joint 02/25/2015    Maralyn Witherell,ANGIE PTA 09/16/2015, 10:51 AM  [facility] [address] [place], [place], [address] Phone: [phone]   Fax:  [phone]  Name: Shawn Fitzgerald MRN: [id number] Date of Birth: 10/16/1985

## 2015-09-21 ENCOUNTER — Ambulatory Visit: Payer: Self-pay | Attending: Orthopedic Surgery | Admitting: Physical Therapy

## 2015-09-21 ENCOUNTER — Encounter: Payer: Self-pay | Admitting: Physical Therapy

## 2015-09-21 DIAGNOSIS — M25612 Stiffness of left shoulder, not elsewhere classified: Secondary | ICD-10-CM

## 2015-09-21 NOTE — Therapy (Signed)
[facility] [address] [place], [place], [address] Phone: [phone]   Fax:  [phone]  Physical Therapy Treatment  Patient Details  Name: Shawn Fitzgerald MRN: [id number] Date of Birth: 11-30-1985 Referring Provider: G.Scott Dean  Encounter Date: 09/21/2015      PT End of Session - 09/21/15 0925    Visit Number 16   Date for PT Re-Evaluation 09/24/15   PT Start Time 0848   PT Stop Time 0928   PT Time Calculation (min) 40 min      Past Medical History:  Diagnosis Date  . Priapism   . Sickle cell trait [facility])     Past Surgical History:  Procedure Laterality Date  . APPENDECTOMY    . SHOULDER ARTHROSCOPY WITH LABRAL REPAIR Left 06/01/2015   Procedure: SHOULDER DIAGNOSTIC OPERATIVE ARTHROSCOPY WITH LABRAL REPAIR;  Surgeon: Cammy Copa, MD;  Location: [facility] OR;  Service: Orthopedics;  Laterality: Left;    There were no vitals filed for this visit.      Subjective Assessment - 09/21/15 0851    Subjective felt like a great workout last session, I just want to be 100%   Currently in Pain? No/denies                         [facility] Adult PT Treatment/Exercise - 09/21/15 0001      Shoulder Exercises: Sidelying   Other Sidelying Exercises plank up downs 2 sets 10  plank opp arm raises 2 sest 10   Other Sidelying Exercises BOSU push ups 2 sets 10  stool rolling 2 sets 10     Shoulder Exercises: Standing   Other Standing Exercises 5# 4 pt rhy stab 15 times each  rhy stab wt ball 2 hand ,1 hand flex and abd   Other Standing Exercises 10# OH press 5 sets 5     Shoulder Exercises: ROM/Strengthening   UBE (Upper Arm Bike) Constant 17fwd/3 back   Other ROM/Strengthening Exercises Chest press 25 lb 2x15 with serratus    Other ROM/Strengthening Exercises seated row 55# 3 sets 10, lat pull 45# 3 sets 10  25# box lifting ground to waist 10 times, waist to OH 10 tim     Shoulder Exercises: Body  Blade   Flexion 45 seconds;2 reps   ABduction 45 seconds;2 reps   Other Body Blade Exercises PNF                  PT Short Term Goals - 07/26/15 0829      PT SHORT TERM GOAL #1   Title independent with initial HEP   Status Achieved           PT Long Term Goals - 09/21/15 0855      PT LONG TERM GOAL #1   Title understand the protocol and the importance of following it   Status Achieved     PT LONG TERM GOAL #2   Title increase AROM of the left shoulder to WNL's   Status On-going     PT LONG TERM GOAL #3   Title decrease pain with ADL's to no pain   Status Achieved     PT LONG TERM GOAL #4   Title lift 10# overhead   Status On-going               Plan - 09/21/15 0855    Clinical Impression Statement tolerated all therapy interventions well,  still difficult with oh activities and fatigue.   PT Next Visit Plan D/C, insurance ends 9/8. Educate on gym transition.      Patient will benefit from skilled therapeutic intervention in order to improve the following deficits and impairments:  Decreased knowledge of precautions, Decreased range of motion, Decreased strength, Increased edema, Increased muscle spasms, Impaired flexibility, Postural dysfunction, Pain, Impaired UE functional use  Visit Diagnosis: Stiffness of left shoulder, not elsewhere classified     Problem List Patient Active Problem List   Diagnosis Date Noted  . Dislocation of left shoulder joint 02/25/2015    Tarique Loveall,ANGIE PTA 09/21/2015, 9:26 AM  [facility] [address] [place], [place], [address] Phone: [phone]   Fax:  [phone]  Name: Jeannetta Ellisric F Bruschi MRN: [id number] Date of Birth: 10/17/1985

## 2015-09-23 ENCOUNTER — Ambulatory Visit: Payer: Self-pay | Admitting: Physical Therapy

## 2015-09-23 DIAGNOSIS — M25612 Stiffness of left shoulder, not elsewhere classified: Secondary | ICD-10-CM

## 2015-09-23 NOTE — Therapy (Signed)
[address] [place], [place], [address] Phone: [phone]   Fax:  [phone]  Physical Therapy Treatment  Patient Details  Name: Shawn Fitzgerald MRN: [id number] Date of Birth: 1985-03-13 Referring Provider: G.Scott Dean  Encounter Date: 09/23/2015      PT End of Session - 09/23/15 0855    Date for PT Re-Evaluation 09/24/15   PT Start Time 0845   PT Stop Time 0930   PT Time Calculation (min) 45 min      Past Medical History:  Diagnosis Date  . Priapism   . Sickle cell trait [facility])     Past Surgical History:  Procedure Laterality Date  . APPENDECTOMY    . SHOULDER ARTHROSCOPY WITH LABRAL REPAIR Left 06/01/2015   Procedure: SHOULDER DIAGNOSTIC OPERATIVE ARTHROSCOPY WITH LABRAL REPAIR;  Surgeon: Meredith Pel, MD;  Location: [place];  Service: Orthopedics;  Laterality: Left;    There were no vitals filed for this visit.      Subjective Assessment - 09/23/15 0850    Subjective still checking into gym options   Currently in Pain? No/denies            [facility] PT Assessment - 09/23/15 0001      AROM   AROM Assessment Site Shoulder   Right/Left Shoulder Left   Left Shoulder Flexion 165 Degrees   Left Shoulder ABduction 160 Degrees   Left Shoulder Internal Rotation 55 Degrees   Left Shoulder External Rotation 90 Degrees                     [facility] Adult PT Treatment/Exercise - 09/23/15 0001      Shoulder Exercises: Standing   Other Standing Exercises 55# 2 sets 15 upright row   Other Standing Exercises tricep pull down 75# 2 sets 15. bicep curl 2 sets 15 75#  8# 4 pt rhy stab 15 times each     Shoulder Exercises: Pulleys   Other Pulley Exercises 15# ext,horz abd, PNF 2 sets 15     Shoulder Exercises: ROM/Strengthening   UBE (Upper Arm Bike) Constant 73fd/3 back   Other ROM/Strengthening Exercises Chest press 25 lb 2x15 with serratus    Other ROM/Strengthening Exercises  Seated row 55# 2 sets 15, Lat pull 45# 2 sets 15                PT Education - 09/23/15 0857    Education provided Yes   Education Details gym safety and progression   Person(s) Educated Patient   Methods Explanation   Comprehension Verbalized understanding          PT Short Term Goals - 07/26/15 0829      PT SHORT TERM GOAL #1   Title independent with initial HEP   Status Achieved           PT Long Term Goals - 09/23/15 0850      PT LONG TERM GOAL #1   Title understand the protocol and the importance of following it   Status Achieved     PT LONG TERM GOAL #2   Title increase AROM of the left shoulder to WNL's   Status On-going     PT LONG TERM GOAL #3   Title decrease pain with ADL's to no pain   Status Achieved     PT LONG TERM GOAL #4   Title lift 10# overhead  Plan - 09/23/15 0856    Clinical Impression Statement Goals met except still limited ROM. Pt with understanding of gym progression as insurance runs out 9/8.   PT Next Visit Plan D/C      Patient will benefit from skilled therapeutic intervention in order to improve the following deficits and impairments:     Visit Diagnosis: Stiffness of left shoulder, not elsewhere classified     Problem List Patient Active Problem List   Diagnosis Date Noted  . Dislocation of left shoulder joint 02/25/2015    Alec Mcphee,ANGIE PTA 09/23/2015, 9:11 AM  [address] [place], [place], [address] Phone: [phone]   Fax:  [phone]  Name: Shawn Fitzgerald MRN: [id number] Date of Birth: [birth year]

## 2015-10-12 ENCOUNTER — Telehealth: Payer: Self-pay

## 2015-10-12 NOTE — Telephone Encounter (Signed)
REMINDER CALL GCCN CARD EXPIRED, LMTCB

## 2015-11-09 ENCOUNTER — Telehealth (INDEPENDENT_AMBULATORY_CARE_PROVIDER_SITE_OTHER): Payer: Self-pay | Admitting: *Deleted

## 2015-11-09 NOTE — Telephone Encounter (Signed)
Pt. Called to make appointment following his physical therapy. Pt number is [phone].

## 2015-11-10 NOTE — Telephone Encounter (Signed)
Can you please call him and schedule him to see Dr August Saucerean at next available?  Thanks!

## 2015-11-11 NOTE — Telephone Encounter (Signed)
LMOM to schedule appt. With Dr. August Saucerean for next available.

## 2016-12-02 ENCOUNTER — Ambulatory Visit (HOSPITAL_COMMUNITY)
Admission: EM | Admit: 2016-12-02 | Discharge: 2016-12-02 | Disposition: A | Discharge status: Home / Self Care | Payer: No Typology Code available for payment source | Attending: Emergency Medicine | Admitting: Emergency Medicine

## 2016-12-02 ENCOUNTER — Encounter (HOSPITAL_COMMUNITY): Payer: Self-pay | Admitting: Family Medicine

## 2016-12-02 DIAGNOSIS — S29019A Strain of muscle and tendon of unspecified wall of thorax, initial encounter: Secondary | ICD-10-CM

## 2016-12-02 DIAGNOSIS — S46812A Strain of other muscles, fascia and tendons at shoulder and upper arm level, left arm, initial encounter: Secondary | ICD-10-CM

## 2016-12-02 MED ORDER — NAPROXEN 375 MG PO TABS: 375.0000 mg | ORAL_TABLET | Freq: Two times a day (BID) | ORAL | 0 refills | Status: DC

## 2016-12-02 MED ORDER — CYCLOBENZAPRINE HCL 5 MG PO TABS: 5.0000 mg | ORAL_TABLET | Freq: Three times a day (TID) | ORAL | 0 refills | Status: DC | PRN

## 2016-12-02 NOTE — ED Triage Notes (Signed)
Pt here for left shoulder pain radiating into neck. Reports hx of shoulder surgery. Denies any specific injury.

## 2016-12-02 NOTE — Discharge Instructions (Signed)
Follow instructions for care of muscle strain, start using heat at this point and wear the arm sling for 3 days but no more. He may remove it periodically to move the shoulder around so it does not get stiff.

## 2016-12-02 NOTE — ED Provider Notes (Signed)
[facility]    CSN: [id number] Arrival date & time: 12/02/16  1246     History   Chief Complaint Chief Complaint  Patient presents with  . Shoulder Pain    HPI Shawn Fitzgerald is a 31 y.o. male.   31 year old male states that he woke up Monday morning which was 5 days ago with pain down the left side of his back and a portion of the lower left neck. He states his neck felt stiff and he has the pain which is worse with certain movements. It is not getting any better. He has tried and OTC medication without relief. He has a history of left shoulder surgery 2 years ago. He has been through several sessions of physical therapy. There is no actual shoulder pain although occasionally he will feel a discomfort that runs down the left arm.  Interestingly the day preceding the onset of pain he had lifted a bed and carried it on his back. No fall or other trauma.      Past Medical History:  Diagnosis Date  . Priapism   . Sickle cell trait [facility](HCC)     Patient Active Problem List   Diagnosis Date Noted  . Dislocation of left shoulder joint 02/25/2015    Past Surgical History:  Procedure Laterality Date  . APPENDECTOMY    . SHOULDER DIAGNOSTIC OPERATIVE ARTHROSCOPY WITH LABRAL REPAIR Left 06/01/2015   Performed by Cammy Copaean, Scott Gregory, MD at [facility] OR       Home Medications    Prior to Admission medications   Medication Sig Start Date End Date Taking? Authorizing Provider  cyclobenzaprine (FLEXERIL) 5 MG tablet Take 1 tablet (5 mg total) 3 (three) times daily as needed by mouth for muscle spasms. 12/02/16   Hayden RasmussenMabe, Natane Heward, NP  naproxen (NAPROSYN) 375 MG tablet Take 1 tablet (375 mg total) 2 (two) times daily by mouth. 12/02/16   Hayden RasmussenMabe, Thamar Holik, NP    Family History History reviewed. No pertinent family history.  Social History Social History   Tobacco Use  . Smoking status: Current Every Day Smoker    Types: Cigars  . Smokeless tobacco: Never Used  . Tobacco  comment: 1 cigar a day- 2 years  Substance Use Topics  . Alcohol use: Yes    Alcohol/week: 0.0 oz    Comment: occ.  . Drug use: Yes    Types: Marijuana    Comment: Last time - 05/31/15     Allergies   Patient has no known allergies.   Review of Systems Review of Systems  Constitutional: Negative.   Respiratory: Negative.   Gastrointestinal: Negative.   Genitourinary: Negative.   Musculoskeletal:       As per HPI  Skin: Negative.   Neurological: Negative for dizziness, weakness, numbness and headaches.  All other systems reviewed and are negative.    Physical Exam Triage Vital Signs ED Triage Vitals  Enc Vitals Group     BP 12/02/16 1409 126/77     Pulse Rate 12/02/16 1409 61     Resp 12/02/16 1409 18     Temp 12/02/16 1409 98 F (36.7 C)     Temp src --      SpO2 12/02/16 1409 100 %     Weight --      Height --      Head Circumference --      Peak Flow --      Pain Score 12/02/16 1410 8     Pain  Loc --      Pain Edu? --      Excl. in GC? --    No data found.  Updated Vital Signs BP 126/77   Pulse 61   Temp 98 F (36.7 C)   Resp 18   SpO2 100%   Visual Acuity Right Eye Distance:   Left Eye Distance:   Bilateral Distance:    Right Eye Near:   Left Eye Near:    Bilateral Near:     Physical Exam  Constitutional: He is oriented to person, place, and time. He appears well-developed and well-nourished.  HENT:  Head: Normocephalic and atraumatic.  Eyes: EOM are normal. Left eye exhibits no discharge.  Neck: Normal range of motion. Neck supple.  Musculoskeletal: He exhibits no edema or deformity.  Tenderness over the left parathoracic musculature and medial trapezius muscle. Palpation of muscle spasm. Demonstrates full range of motion of the left shoulder. No tenderness to the left shoulder, no asymmetry. Neurovascular motor sensory grossly intact left upper extremities.  Neurological: He is alert and oriented to person, place, and time. No cranial  nerve deficit.  Skin: Skin is warm and dry.  Psychiatric: He has a normal mood and affect.     UC Treatments / Results  Labs (all labs ordered are listed, but only abnormal results are displayed) Labs Reviewed - No data to display  EKG  EKG Interpretation None       Radiology No results found.  Procedures Procedures (including critical care time)  Medications Ordered in UC Medications - No data to display   Initial Impression / Assessment and Plan / UC Course  I have reviewed the triage vital signs and the nursing notes.  Pertinent labs & imaging results that were available during my care of the patient were reviewed by me and considered in my medical decision making (see chart for details).    Follow instructions for care of muscle strain, start using heat at this point and wear the arm sling for 3 days but no more. He may remove it periodically to move the shoulder around so it does not get stiff.    Final Clinical Impressions(s) / UC Diagnoses   Final diagnoses:  Thoracic myofascial strain, initial encounter  Trapezius strain, left, initial encounter    ED Discharge Orders        Ordered    cyclobenzaprine (FLEXERIL) 5 MG tablet  3 times daily PRN     12/02/16 1442    naproxen (NAPROSYN) 375 MG tablet  2 times daily     12/02/16 1442       Controlled Substance Prescriptions [place] Controlled Substance Registry consulted? Not Applicable  Yes   Hayden RasmussenMabe, Nealy Karapetian, NP 12/02/16 1448

## 2016-12-05 ENCOUNTER — Encounter (HOSPITAL_COMMUNITY): Payer: Self-pay | Admitting: Emergency Medicine

## 2016-12-05 ENCOUNTER — Ambulatory Visit (HOSPITAL_COMMUNITY)
Admission: EM | Admit: 2016-12-05 | Discharge: 2016-12-05 | Disposition: A | Discharge status: Home / Self Care | Payer: Self-pay | Attending: Family Medicine | Admitting: Family Medicine

## 2016-12-05 DIAGNOSIS — M436 Torticollis: Secondary | ICD-10-CM

## 2016-12-05 MED ORDER — PREDNISONE 20 MG PO TABS: ORAL_TABLET | ORAL | 0 refills | Status: AC

## 2016-12-05 MED ORDER — CYCLOBENZAPRINE HCL 5 MG PO TABS: 5.0000 mg | ORAL_TABLET | Freq: Three times a day (TID) | ORAL | 0 refills | Status: AC | PRN

## 2016-12-05 NOTE — Discharge Instructions (Signed)
Do gentle neck range of motion exercise using a towel behind the neck

## 2016-12-05 NOTE — ED Triage Notes (Signed)
PT C/O: pt needing med refill on his flexiril   ONSET: ran out this am.   SX INCLUDE: persistent neck pain  DENIES:   TAKING MEDS: flexiril   A&O x4... NAD... Ambulatory

## 2016-12-05 NOTE — ED Provider Notes (Signed)
[facility]   [id number] 12/05/16 Arrival Time: 1712   SUBJECTIVE:  Shawn Fitzgerald is a 31 y.o. male who presents to the urgent care with complaint of chronic neck pain for the past week for which he has been taking flexeril and naprosyn.  He ran out of the former today.  The original pain began when he woke up from sleep 6 days ago.  Pain radiates into shoulder.  No weakness.  No fever.     Past Medical History:  Diagnosis Date  . Priapism   . Sickle cell trait (HCC)    History reviewed. No pertinent family history. Social History   Socioeconomic History  . Marital status: Married    Spouse name: Not on file  . Number of children: Not on file  . Years of education: Not on file  . Highest education level: Not on file  Social Needs  . Financial resource strain: Not on file  . Food insecurity - worry: Not on file  . Food insecurity - inability: Not on file  . Transportation needs - medical: Not on file  . Transportation needs - non-medical: Not on file  Occupational History  . Not on file  Tobacco Use  . Smoking status: Current Every Day Smoker    Types: Cigars  . Smokeless tobacco: Never Used  . Tobacco comment: 1 cigar a day- 2 years  Substance and Sexual Activity  . Alcohol use: Yes    Alcohol/week: 0.0 oz    Comment: occ.  . Drug use: Yes    Types: Marijuana    Comment: Last time - 05/31/15  . Sexual activity: Yes  Other Topics Concern  . Not on file  Social History Narrative  . Not on file   Current Meds  Medication Sig  . [DISCONTINUED] cyclobenzaprine (FLEXERIL) 5 MG tablet Take 1 tablet (5 mg total) 3 (three) times daily as needed by mouth for muscle spasms.  . [DISCONTINUED] naproxen (NAPROSYN) 375 MG tablet Take 1 tablet (375 mg total) 2 (two) times daily by mouth.   No Known Allergies    ROS: As per HPI, remainder of ROS negative.   OBJECTIVE:   Vitals:   12/05/16 1751  BP: 117/80  Pulse: 76  Resp: (!) 22  Temp: 98.3 F  (36.8 C)  TempSrc: Oral  SpO2: 100%     General appearance: alert; no distress Eyes: PERRL; EOMI; conjunctiva normal HENT: normocephalic; atraumatic; TMs normal, canal normal, external ears normal without trauma; nasal mucosa normal; oral mucosa normal Neck: supple, but limited range of motion to 30 degrees rotation.  Non-tender. Back: no CVA tenderness Extremities: no cyanosis or edema; symmetrical with no gross deformities Skin: warm and dry Neurologic: normal gait; grossly normal Psychological: alert and cooperative; normal mood and affect; poor eye contact      Labs:  Results for orders placed or performed during the hospital encounter of 06/01/15  Comprehensive metabolic panel  Result Value Ref Range   Sodium 138 135 - 145 mmol/L   Potassium 3.7 3.5 - 5.1 mmol/L   Chloride 106 101 - 111 mmol/L   CO2 26 22 - 32 mmol/L   Glucose, Bld 78 65 - 99 mg/dL   BUN 12 6 - 20 mg/dL   Creatinine, Ser 5.621.03 0.61 - 1.24 mg/dL   Calcium 8.7 (L) 8.9 - 10.3 mg/dL   Total Protein 6.3 (L) 6.5 - 8.1 g/dL   Albumin 3.8 3.5 - 5.0 g/dL   AST 21 15 -  41 U/L   ALT 14 (L) 17 - 63 U/L   Alkaline Phosphatase 40 38 - 126 U/L   Total Bilirubin 0.6 0.3 - 1.2 mg/dL   GFR calc non Af Amer >60 >60 mL/min   GFR calc Af Amer >60 >60 mL/min   Anion gap 6 5 - 15    Labs Reviewed - No data to display  No results found.     ASSESSMENT & PLAN:  1. Torticollis, acquired     Meds ordered this encounter  Medications  . cyclobenzaprine (FLEXERIL) 5 MG tablet    Sig: Take 1 tablet (5 mg total) by mouth 3 (three) times daily as needed for muscle spasms.    Dispense:  30 tablet    Refill:  0  . predniSONE (DELTASONE) 20 MG tablet    Sig: Two daily with food    Dispense:  10 tablet    Refill:  0    Reviewed expectations re: course of current medical issues. Questions answered. Outlined signs and symptoms indicating need for more acute intervention. Patient verbalized understanding. After  Visit Summary given.    Procedures:      Elvina SidleLauenstein, Fantasia Jinkins, MD 12/05/16 [id number]

## 2016-12-10 ENCOUNTER — Emergency Department (HOSPITAL_COMMUNITY)
Admission: EM | Admit: 2016-12-10 | Discharge: 2016-12-10 | Disposition: A | Discharge status: Home / Self Care | Payer: Medicaid Other | Attending: Emergency Medicine | Admitting: Emergency Medicine

## 2016-12-10 ENCOUNTER — Encounter (HOSPITAL_COMMUNITY): Payer: Self-pay | Admitting: Emergency Medicine

## 2016-12-10 DIAGNOSIS — F17290 Nicotine dependence, other tobacco product, uncomplicated: Secondary | ICD-10-CM | POA: Insufficient documentation

## 2016-12-10 DIAGNOSIS — M25512 Pain in left shoulder: Secondary | ICD-10-CM

## 2016-12-10 MED ORDER — TRAMADOL HCL 50 MG PO TABS: 50.0000 mg | ORAL_TABLET | Freq: Four times a day (QID) | ORAL | 0 refills | Status: AC | PRN

## 2016-12-10 MED ORDER — NAPROXEN 500 MG PO TABS: 500.0000 mg | ORAL_TABLET | Freq: Two times a day (BID) | ORAL | 0 refills | Status: AC

## 2016-12-10 NOTE — ED Triage Notes (Signed)
Reports having neck pain that goes down into left shoulder for almost two weeks.  [facility]  Sent home with scripts for prednison, naproxen, and flexeril.  Reports that the pain in neck is better but the shoulder pain is worse.  Also reports arm will "jump" when stretched out.  Last dose of medication reported around 1pm.

## 2016-12-10 NOTE — ED Provider Notes (Signed)
[facility] Provider Note   CSN: [id number] Arrival date & time: 12/10/16  0103     History   Chief Complaint Chief Complaint  Patient presents with  . Shoulder Pain  . Neck Pain    HPI Shawn Fitzgerald is a 31 y.o. male.  Patient is a 31 year old male with history of sickle cell trait, left shoulder surgery in the past presenting for evaluation of left shoulder pain.  This is been present for the past 2 weeks.  He believes it started after lifting a heavy object at work.  He denies weakness but does report radiation into his arm.  He is feeling twitches of his tricep muscle and this has him concerned.      Past Medical History:  Diagnosis Date  . Priapism   . Sickle cell trait [facility](HCC)     Patient Active Problem List   Diagnosis Date Noted  . Dislocation of left shoulder joint 02/25/2015    Past Surgical History:  Procedure Laterality Date  . APPENDECTOMY    . SHOULDER ARTHROSCOPY WITH LABRAL REPAIR Left 06/01/2015   Procedure: SHOULDER DIAGNOSTIC OPERATIVE ARTHROSCOPY WITH LABRAL REPAIR;  Surgeon: Cammy CopaScott Gregory Dean, MD;  Location: [facility] OR;  Service: Orthopedics;  Laterality: Left;       Home Medications    Prior to Admission medications   Medication Sig Start Date End Date Taking? Authorizing Provider  cyclobenzaprine (FLEXERIL) 5 MG tablet Take 1 tablet (5 mg total) by mouth 3 (three) times daily as needed for muscle spasms. 12/05/16   Elvina SidleLauenstein, Kurt, MD  predniSONE (DELTASONE) 20 MG tablet Two daily with food 12/05/16   Elvina SidleLauenstein, Kurt, MD    Family History No family history on file.  Social History Social History   Tobacco Use  . Smoking status: Current Every Day Smoker    Types: Cigars  . Smokeless tobacco: Never Used  . Tobacco comment: 1 cigar a day- 2 years  Substance Use Topics  . Alcohol use: Yes    Alcohol/week: 0.0 oz    Comment: occ.  . Drug use: Yes    Types: Marijuana    Comment: Last time -  05/31/15     Allergies   Patient has no known allergies.   Review of Systems Review of Systems  All other systems reviewed and are negative.    Physical Exam Updated Vital Signs BP (!) 144/84 (BP Location: Right Arm)   Pulse 93   Temp 98.4 F (36.9 C) (Oral)   Resp 18   Ht 5\' 9"  (1.753 m)   Wt 65.8 kg (145 lb)   SpO2 99%   BMI 21.41 kg/m   Physical Exam  Constitutional: He is oriented to person, place, and time. He appears well-developed and well-nourished. No distress.  HENT:  Head: Normocephalic and atraumatic.  Neck: Normal range of motion. Neck supple.  Musculoskeletal:  Left shoulder appears grossly normal.  There is no swelling or inflammation.  He has full range of motion with minimal discomfort.  Ulnar and radial pulses are easily palpable.  He is able to flex, extend, and oppose all fingers.  Sensation is intact throughout the entire hand.  Neurological: He is alert and oriented to person, place, and time.  Skin: Skin is warm and dry. He is not diaphoretic.  Nursing note and vitals reviewed.    ED Treatments / Results  Labs (all labs ordered are listed, but only abnormal results are displayed) Labs Reviewed - No data  to display  EKG  EKG Interpretation None       Radiology No results found.  Procedures Procedures (including critical care time)  Medications Ordered in ED Medications - No data to display   Initial Impression / Assessment and Plan / ED Course  I have reviewed the triage vital signs and the nursing notes.  Pertinent labs & imaging results that were available during my care of the patient were reviewed by me and considered in my medical decision making (see chart for details).  This appears to be a rotator cuff tendinitis.  This will be treated with naproxen, tramadol, continued use of his arm sling and follow-up with his shoulder surgeon if not improving.  Final Clinical Impressions(s) / ED Diagnoses   Final diagnoses:  None     ED Discharge Orders    None       Geoffery Lyonselo, Aysha Livecchi, MD 12/10/16 [phone]

## 2016-12-10 NOTE — Discharge Instructions (Signed)
Stop taking prednisone.  Begin taking naproxen and tramadol as prescribed.  Follow-up with Dr. August Saucerean if symptoms are not improving in the next 3-4 days.  His contact information has been provided in this discharge summary for you to call and make these arrangements.  Continue wearing your sling as before.

## 2016-12-20 ENCOUNTER — Ambulatory Visit (INDEPENDENT_AMBULATORY_CARE_PROVIDER_SITE_OTHER): Payer: Medicaid Other

## 2016-12-20 ENCOUNTER — Encounter (INDEPENDENT_AMBULATORY_CARE_PROVIDER_SITE_OTHER): Payer: Self-pay | Admitting: Orthopedic Surgery

## 2016-12-20 ENCOUNTER — Ambulatory Visit (INDEPENDENT_AMBULATORY_CARE_PROVIDER_SITE_OTHER): Payer: Medicaid Other | Admitting: Orthopedic Surgery

## 2016-12-20 DIAGNOSIS — M5410 Radiculopathy, site unspecified: Secondary | ICD-10-CM

## 2016-12-20 NOTE — Progress Notes (Signed)
Office Visit Note   Patient: Shawn Fitzgerald           Date of Birth: 08/06/1985           MRN: [id number] Visit Date: 12/20/2016 Requested by: Gwenyth Benderean, Marie L, MD [address] [place], [place] [id number] PCP: Gwenyth Benderean, Farrel L, MD  Subjective: Chief Complaint  Patient presents with  . Arm Pain    left arm pain    HPI: Shawn Fitzgerald is a patient with left arm pain of 2-3 months duration worse over the last 3 weeks.  He states that the pain comes and goes.  He has a fairly severe radicular pain with numbness and tingling as well as neck pain.  He had left shoulder arthroscopy for shoulder instability a year and a half ago.  He states that his pain began this summer when he lifted a bed wrong.  States that the pain radiates into the shoulder blade region and down into his fingers.  He is tried naproxen tramadol and prednisone for this problem.  States that the pain wakes him from sleep.  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              ROS: All systems reviewed are negative as they relate to the chief complaint within the history of present illness.  Patient denies  fevers or chills.   Assessment & Plan: Visit Diagnoses:  1. Radicular pain     Plan: Impression is radicular left arm pain with stable shoulder.  No weakness is present but his exam and particularly history findings are classic for radicular symptoms.  Plain radiographs unremarkable.  Patient needs MRI scan of his neck to evaluate for cervical radiculopathy affecting the left hand side  Follow-Up Instructions: Return for after MRI.   Orders:  Orders Placed This Encounter  Procedures  . XR Cervical Spine 2 or 3 views  . MR Cervical Spine w/o contrast   No orders of the defined types were placed in this encounter.     Procedures: No procedures performed   Clinical Data: No additional findings.  Objective: Vital Signs: There were no vitals taken for this visit.  Physical Exam:    Constitutional: Patient appears well-developed HEENT:  Head: Normocephalic Eyes:EOM are normal Neck: Normal range of motion Cardiovascular: Normal rate Pulmonary/chest: Effort normal Neurologic: Patient is alert Skin: Skin is warm Psychiatric: Patient has normal mood and affect    Ortho Exam: Orthopedic exam demonstrates some pain with rotation of his head to the left.  He also is lacking significant extension with only about 10 degrees past vertical of extension.  Flexion is closer to chin to chest.  Rotation is about 30 degrees to the left and 50 to the right.  Patient has 5 out of 5 grip EPL FPL interosseous flexion wrist extension biceps triceps and deltoid strength.  No definite paresthesias C5-T1 and patient has symmetric reflexes bilateral biceps and triceps.  No other masses lymphadenopathy or skin changes noted in the neck or arm region.  Excellent shoulder stability and excellent rotator cuff strength  Specialty Comments:  No specialty comments available.  Imaging: Xr Cervical Spine 2 Or 3 Views  Result Date: 12/20/2016 AP lateral cervical spine reviewed.  There is loss of lordosis.  No significant degenerative disc disease or facet arthritis is present.  No spondylolisthesis.    PMFS History: Patient Active Problem List   Diagnosis Date Noted  . Dislocation of left shoulder  joint 02/25/2015   Past Medical History:  Diagnosis Date  . Priapism   . Sickle cell trait (HCC)     History reviewed. No pertinent family history.  Past Surgical History:  Procedure Laterality Date  . APPENDECTOMY    . SHOULDER ARTHROSCOPY WITH LABRAL REPAIR Left 06/01/2015   Procedure: SHOULDER DIAGNOSTIC OPERATIVE ARTHROSCOPY WITH LABRAL REPAIR;  Surgeon: Cammy CopaScott Nakeita Styles, MD;  Location: [facility] OR;  Service: Orthopedics;  Laterality: Left;   Social History   Occupational History  . Not on file  Tobacco Use  . Smoking status: Current Every Day Smoker    Types: Cigars  . Smokeless  tobacco: Never Used  . Tobacco comment: 1 cigar a day- 2 years  Substance and Sexual Activity  . Alcohol use: Yes    Alcohol/week: 0.0 oz    Comment: occ.  . Drug use: Yes    Types: Marijuana    Comment: Last time - 05/31/15  . Sexual activity: Yes

## 2016-12-29 ENCOUNTER — Telehealth (INDEPENDENT_AMBULATORY_CARE_PROVIDER_SITE_OTHER): Payer: Self-pay

## 2016-12-29 NOTE — Telephone Encounter (Signed)
Patients insurance denied coverage of MRI cervical spine. Do you want me to see if I can get peer to peer scheduled for you?

## 2016-12-30 IMAGING — RF DG FLUORO GUIDE NDL PLC/BX
3 series · 3 of 3 positions shown · IV contrast (multihance)
Comparison: none

CLINICAL DATA: Left shoulder pain, recurrent dislocations

EXAM:
LEFT SHOULDER INJECTION UNDER FLUOROSCOPY
TECHNIQUE: An appropriate skin entrance site was determined. The site was
marked, prepped with Betadine, draped in the usual sterile fashion,
and infiltrated locally with buffered Lidocaine. 20 gauge spinal
needle was advanced to the superomedial margin of the humeral head
under intermittent fluoroscopy. 1 ml of Lidocaine injected easily. A
mixture of 0.1 ml Multihance and 20 ml of Omnipaque 300 was then
used to opacify the left shoulder capsule. No immediate
complication.
FLUOROSCOPY TIME:  Radiation Exposure Index (as provided by the
fluoroscopic device): 0.80 mGy

[Series 1: cp_standard · 0.19mm/px · 1 of 1 slices shown (1 of 3)]
[im 1/1]
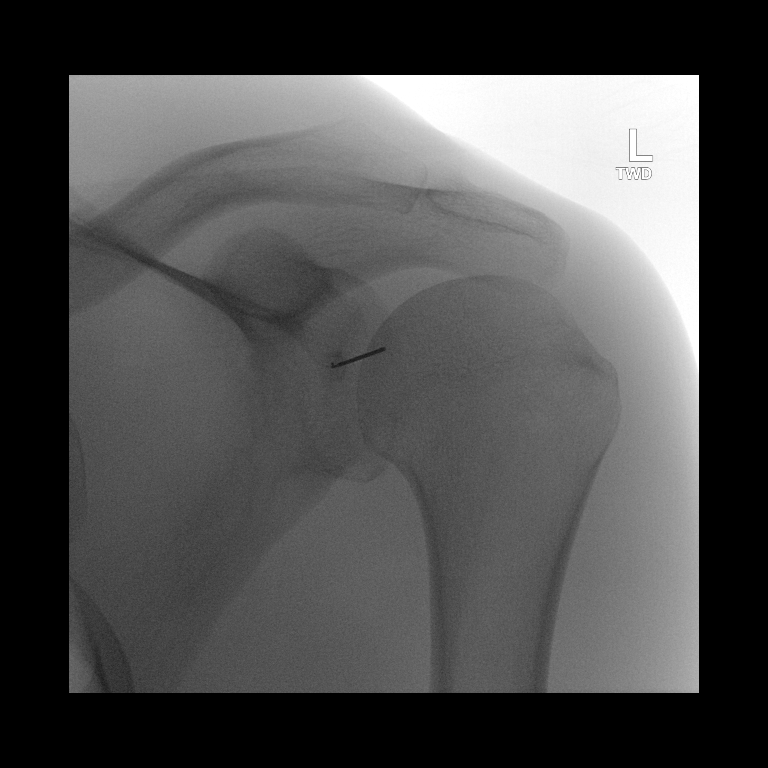

[Series 2: cp_standard · 0.19mm/px · 1 of 1 slices shown (2 of 3)]
[im 1/1]
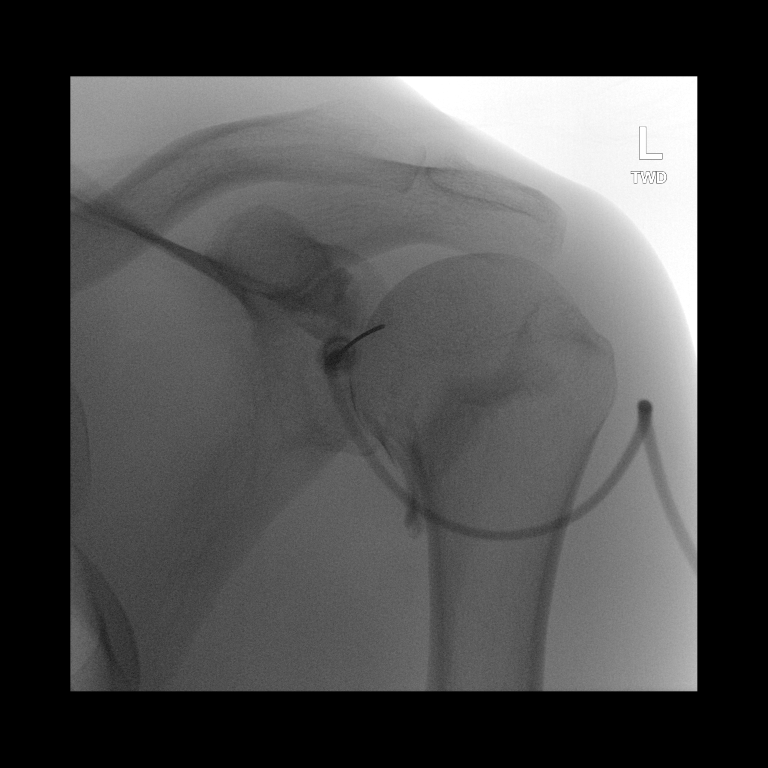

[Series 3: cp_standard · 0.19mm/px · 1 of 1 slices shown (3 of 3)]
[im 1/1]
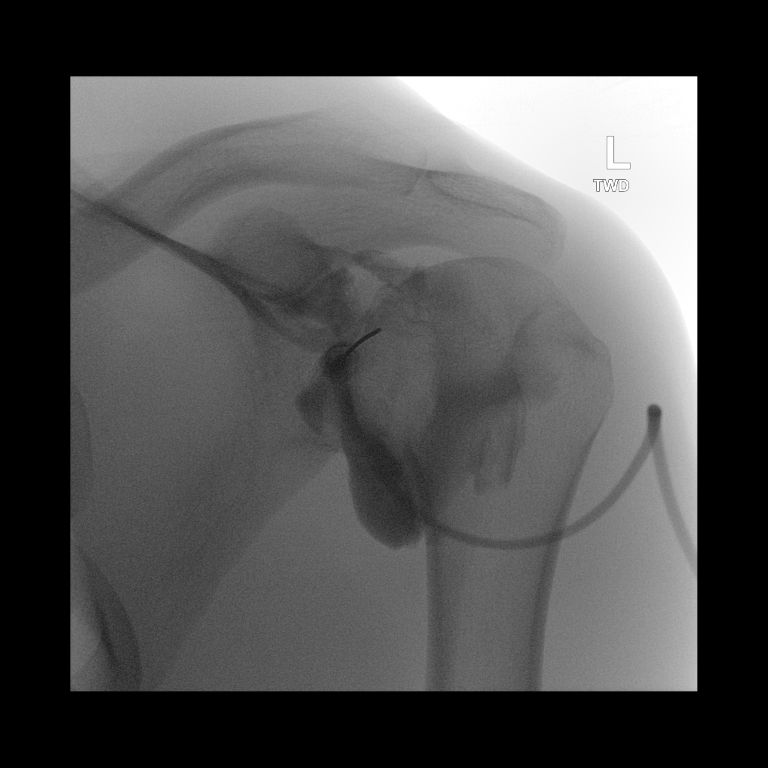

[3 of 3 positions shown; findings below may reference images not displayed]

FINDINGS: Successful fluoroscopic guided left shoulder injection for MRI.
IMPRESSION: Technically successful left shoulder injection for MRI.

## 2016-12-30 IMAGING — MR MR SHOULDER*L* W/ CM
4 of 5 series · 19 of 40 positions shown · IV contrast (2ml   mh)
Comparison: MRI left shoulder 04/24/2015.

CLINICAL DATA: The patient reports at least 6 episodes of his left
shoulder popping out of place since 8114. The patient suffered a
similar episode on 02/19/2015 in which he could not with shoulder
back. Subsequent encounter.

EXAM:
MR ARTHROGRAM OF THE LEFT SHOULDER
TECHNIQUE: Multiplanar, multisequence MR imaging of the left shoulder was
performed following the administration of intra-articular contrast.
CONTRAST:  See Injection Documentation.

[Series 3: T1 fat-sat · axial · 3.0mm · 0.23mm/px · z∈[-51,+28]mm · 3 of 34 slices shown (1 of 2)]
[im 5/34]
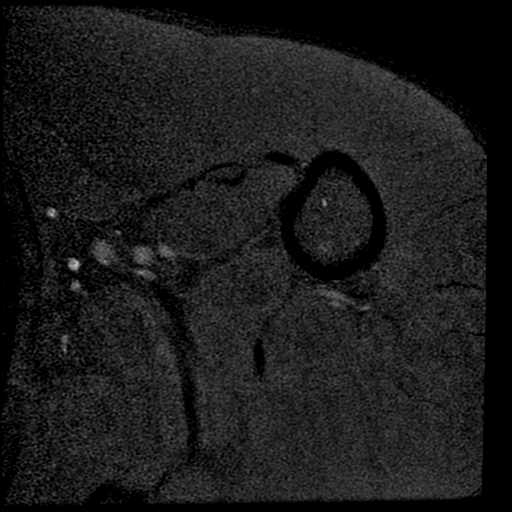
[im 19/34]
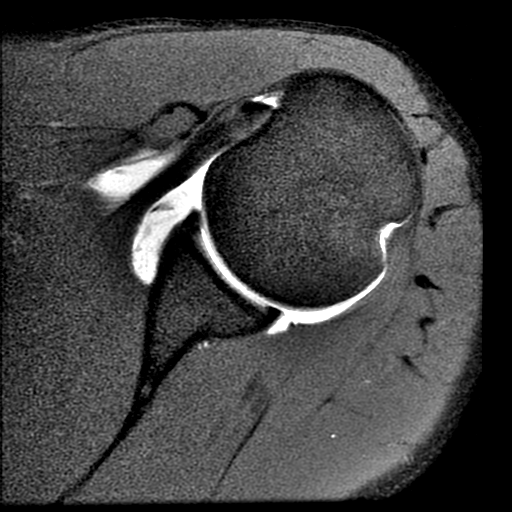
[im 29/34]
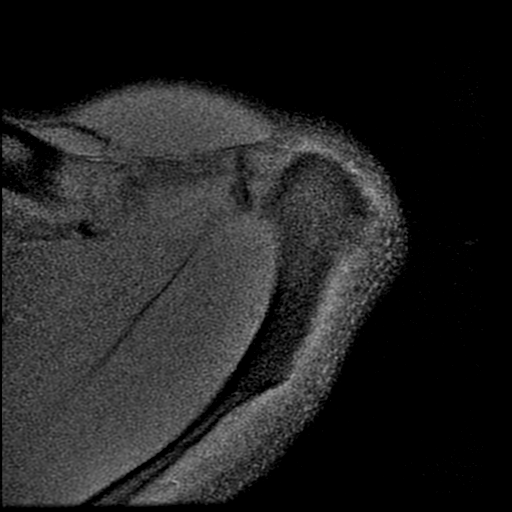

[Series 4: T1 fat-sat · oblique · 3.0mm · 0.27mm/px · 3 of 28 slices shown (2 of 2)]
[im 4/28]
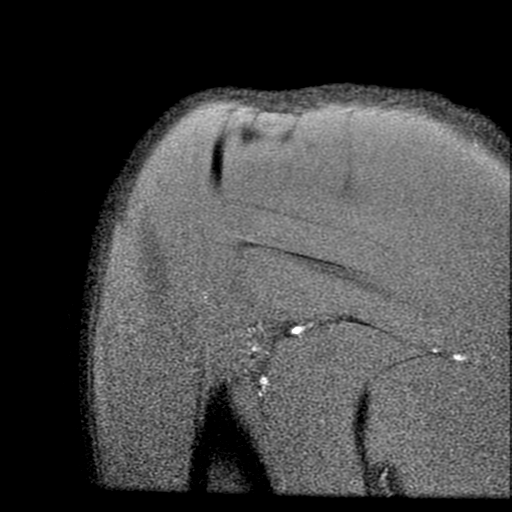
[im 16/28]
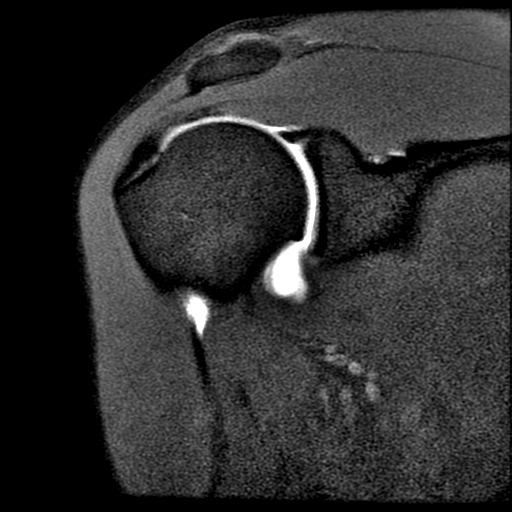
[im 24/28]
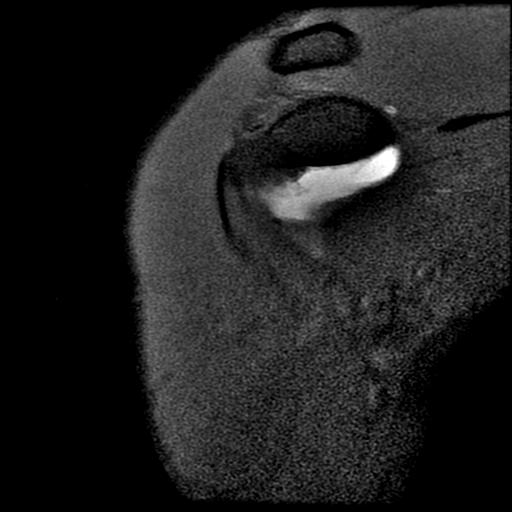

[Series 5: T2 fat-sat · oblique · 3.0mm · 0.27mm/px · 8 of 30 slices shown (1 of 2)]
[im 1/30]
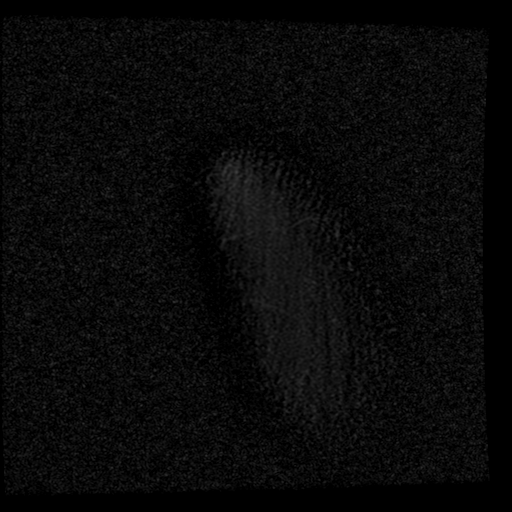
[im 5/30]
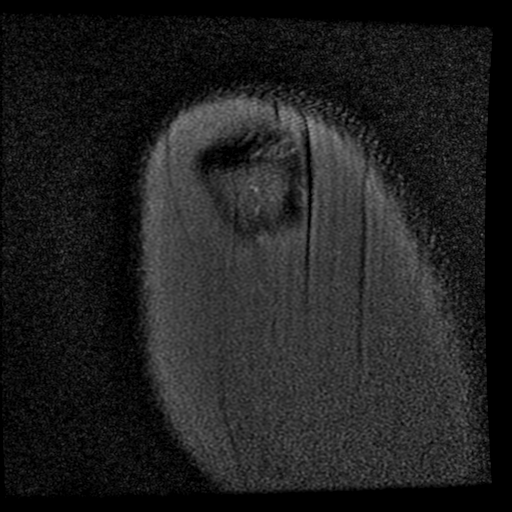
[im 9/30]
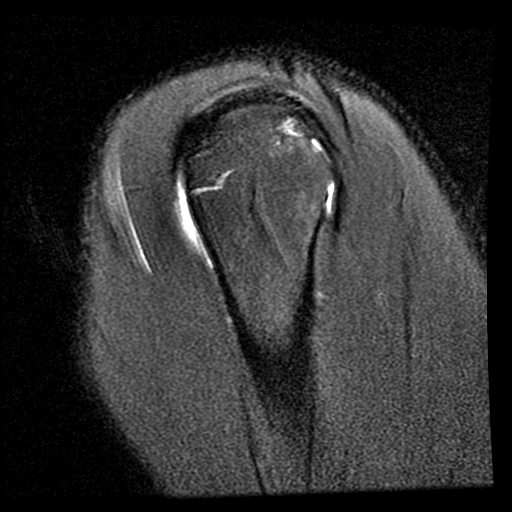
[im 13/30]
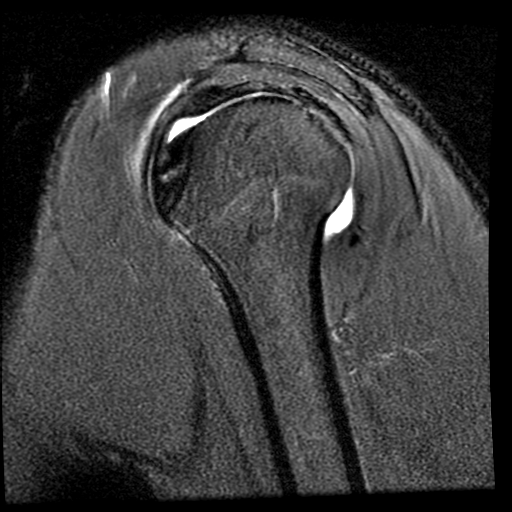
[im 17/30]
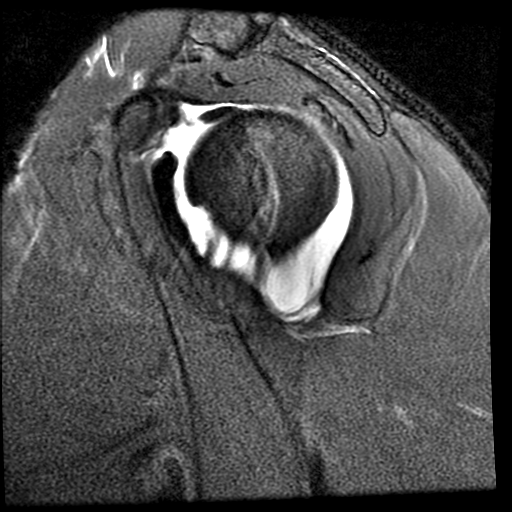
[im 21/30]
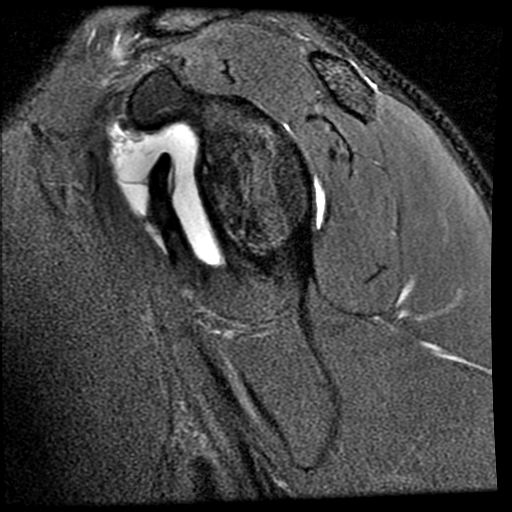
[im 25/30]
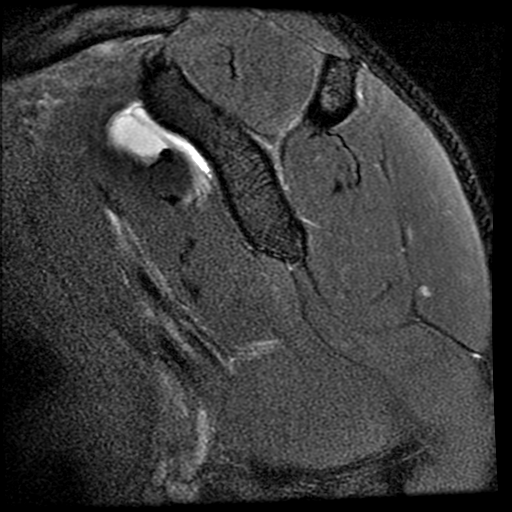
[im 30/30]
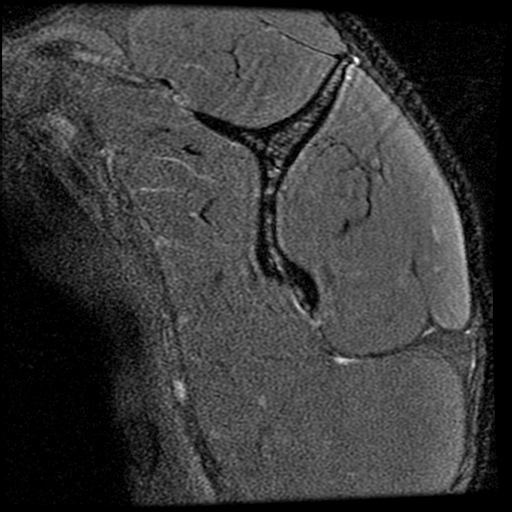

[Series 7: T2 fat-sat · oblique · 3.0mm · 0.27mm/px · 5 of 28 slices shown (2 of 2)]
[im 1/28]
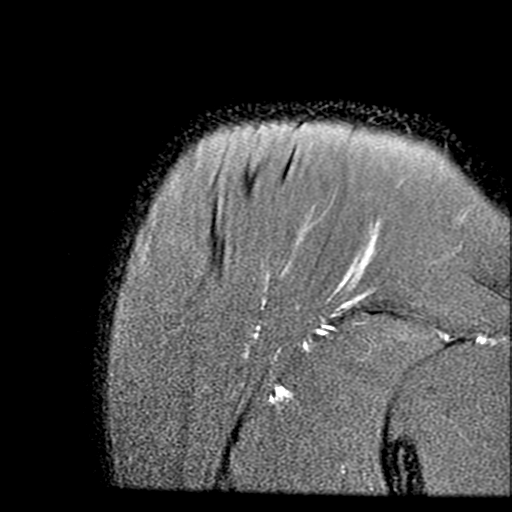
[im 4/28]
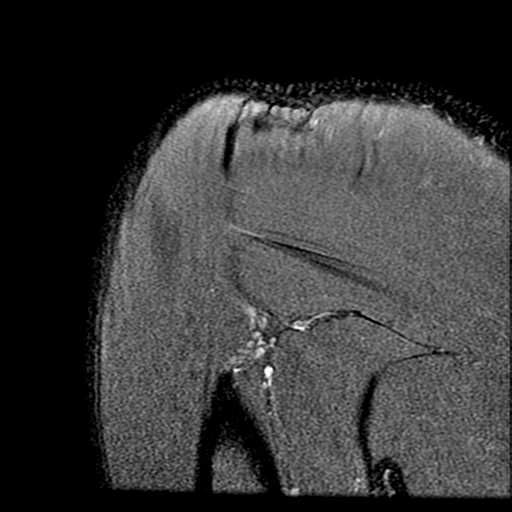
[im 8/28]
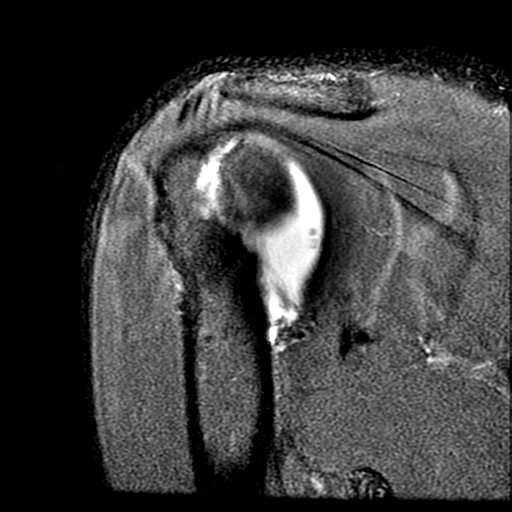
[im 16/28]
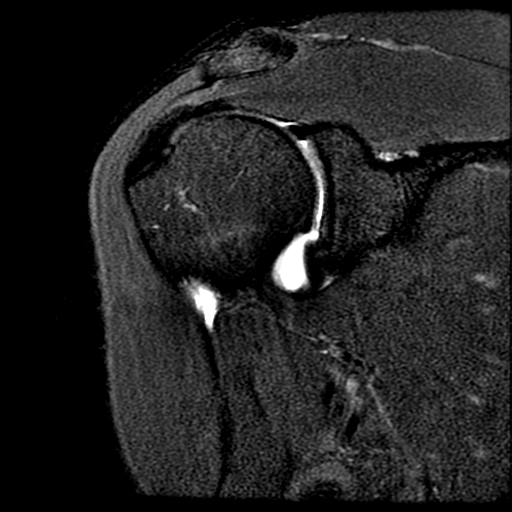
[im 24/28]
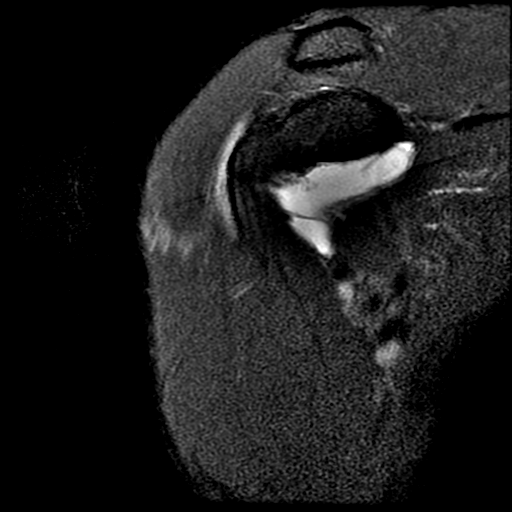

[19 of 40 positions shown; findings below may reference images not displayed]

FINDINGS: Rotator cuff: Intact.

Muscles: No atrophy or focal lesion.

Biceps long head: Intact.

Acromioclavicular Joint: Unremarkable.

Glenohumeral Joint: Unremarkable.

Labrum: The patient has a tear of the anterior, inferior labrum
extending from approximately the 3 o'clock position inferiorly to
near the 6 o'clock position. The tear appears subacute with
intermediate signal intensity material interposed between the labrum
and underlying glenoid likely due to scar tissue. The labrum
otherwise appears intact. No posterior labral tear is identified as
questioned on prior MRI.

Bones: Remote Hill-Sachs lesion is identified. No bony Bankart is
seen. The acromion is type 2. No fluid or contrast is seen in the
subacromial/subdeltoid bursa.
IMPRESSION: Findings consistent with history of prior shoulder dislocation with
an anterior to inferior labral tear which appears chronic with
likely scarring interposed between the labrum and adjacent glenoid.
Remote appearing Hill-Sachs lesion is seen. No bony Bankart.

## 2016-12-30 NOTE — Telephone Encounter (Signed)
What did they say he needed first?

## 2017-01-01 NOTE — Telephone Encounter (Signed)
I have called patient and [facility]. Insurance denied coverage of MRI. Dr August Saucerean wants him to continue with conservative tx at this time and then see him back in January for follow up and then reschedule scan at that time.

## 2017-01-01 NOTE — Telephone Encounter (Signed)
Response from PA below:  Medicaid did not approve this request because: Based on [employer] Guidelines, we cannot approve this request. MRI might be supported in the evaluation of suspected or known spinal disease with one of the following: 1) failure to improve after a recent (within 3 months) 6 week trial of physician-guided clinical care (treatment or observation) with clinical re-evaluation, or 2) any signs or symptoms such as significant motor weakness, malignancy, infection, cauda equina syndrome, for which conservative treatment is not needed. The clinical information received fails to support meeting these requirements and, therefore, the requested procedure is not indicated at this time.

## 2017-01-01 NOTE — Telephone Encounter (Signed)
So we need to see him in the middle of January and then we can order scan thanks

## 2017-01-06 ENCOUNTER — Other Ambulatory Visit: Payer: Medicaid Other
